# Patient Record
Sex: Male | Born: 1955 | Race: White | Hispanic: No | Marital: Married | Smoking: Former smoker
Health system: Southern US, Community
[De-identification: ages and names within clinical notes are randomized; demographics above are authoritative.]

## PROBLEM LIST (undated history)

## (undated) DIAGNOSIS — C801 Malignant (primary) neoplasm, unspecified: Secondary | ICD-10-CM

## (undated) DIAGNOSIS — F329 Major depressive disorder, single episode, unspecified: Secondary | ICD-10-CM

## (undated) DIAGNOSIS — K449 Diaphragmatic hernia without obstruction or gangrene: Secondary | ICD-10-CM

## (undated) DIAGNOSIS — K649 Unspecified hemorrhoids: Secondary | ICD-10-CM

## (undated) DIAGNOSIS — G4730 Sleep apnea, unspecified: Secondary | ICD-10-CM

## (undated) DIAGNOSIS — F32A Depression, unspecified: Secondary | ICD-10-CM

## (undated) DIAGNOSIS — C439 Malignant melanoma of skin, unspecified: Secondary | ICD-10-CM

## (undated) DIAGNOSIS — E785 Hyperlipidemia, unspecified: Secondary | ICD-10-CM

## (undated) DIAGNOSIS — K611 Rectal abscess: Secondary | ICD-10-CM

## (undated) DIAGNOSIS — B1920 Unspecified viral hepatitis C without hepatic coma: Secondary | ICD-10-CM

## (undated) DIAGNOSIS — E119 Type 2 diabetes mellitus without complications: Secondary | ICD-10-CM

## (undated) DIAGNOSIS — I10 Essential (primary) hypertension: Secondary | ICD-10-CM

## (undated) DIAGNOSIS — K219 Gastro-esophageal reflux disease without esophagitis: Secondary | ICD-10-CM

## (undated) DIAGNOSIS — E669 Obesity, unspecified: Secondary | ICD-10-CM

## (undated) DIAGNOSIS — J45909 Unspecified asthma, uncomplicated: Secondary | ICD-10-CM

## (undated) DIAGNOSIS — M1990 Unspecified osteoarthritis, unspecified site: Secondary | ICD-10-CM

## (undated) HISTORY — DX: Essential (primary) hypertension: I10

## (undated) HISTORY — DX: Major depressive disorder, single episode, unspecified: F32.9

## (undated) HISTORY — DX: Sleep apnea, unspecified: G47.30

## (undated) HISTORY — DX: Gastro-esophageal reflux disease without esophagitis: K21.9

## (undated) HISTORY — DX: Rectal abscess: K61.1

## (undated) HISTORY — DX: Diaphragmatic hernia without obstruction or gangrene: K44.9

## (undated) HISTORY — DX: Malignant (primary) neoplasm, unspecified: C80.1

## (undated) HISTORY — DX: Unspecified hemorrhoids: K64.9

## (undated) HISTORY — PX: ANAL FISSURE REPAIR: SHX2312

## (undated) HISTORY — DX: Malignant melanoma of skin, unspecified: C43.9

## (undated) HISTORY — DX: Unspecified viral hepatitis C without hepatic coma: B19.20

## (undated) HISTORY — DX: Obesity, unspecified: E66.9

## (undated) HISTORY — PX: TONSILLECTOMY: SUR1361

## (undated) HISTORY — DX: Depression, unspecified: F32.A

## (undated) HISTORY — DX: Hyperlipidemia, unspecified: E78.5

## (undated) HISTORY — DX: Type 2 diabetes mellitus without complications: E11.9

## (undated) HISTORY — DX: Unspecified asthma, uncomplicated: J45.909

## (undated) HISTORY — DX: Unspecified osteoarthritis, unspecified site: M19.90

---

## 1987-01-26 HISTORY — PX: ROTATOR CUFF REPAIR: SHX139

## 1996-01-26 HISTORY — PX: HIATAL HERNIA REPAIR: SHX195

## 1997-10-22 ENCOUNTER — Ambulatory Visit (HOSPITAL_COMMUNITY): Admission: RE | Admit: 1997-10-22 | Discharge: 1997-10-22 | Payer: Self-pay | Admitting: Gastroenterology

## 1997-10-22 ENCOUNTER — Encounter: Payer: Self-pay | Admitting: Gastroenterology

## 1998-05-23 ENCOUNTER — Encounter: Payer: Self-pay | Admitting: Gastroenterology

## 1998-05-23 ENCOUNTER — Ambulatory Visit (HOSPITAL_COMMUNITY): Admission: RE | Admit: 1998-05-23 | Discharge: 1998-05-23 | Payer: Self-pay | Admitting: Gastroenterology

## 2000-01-14 ENCOUNTER — Encounter: Admission: RE | Admit: 2000-01-14 | Discharge: 2000-04-13 | Payer: Self-pay | Admitting: Family Medicine

## 2001-09-19 ENCOUNTER — Ambulatory Visit (HOSPITAL_BASED_OUTPATIENT_CLINIC_OR_DEPARTMENT_OTHER): Admission: RE | Admit: 2001-09-19 | Discharge: 2001-09-19 | Payer: Self-pay | Admitting: Orthopedic Surgery

## 2007-03-21 ENCOUNTER — Ambulatory Visit: Payer: Self-pay | Admitting: Gastroenterology

## 2007-03-27 ENCOUNTER — Ambulatory Visit: Payer: Self-pay | Admitting: Gastroenterology

## 2007-04-04 ENCOUNTER — Ambulatory Visit: Payer: Self-pay | Admitting: Internal Medicine

## 2007-08-18 ENCOUNTER — Encounter: Payer: Self-pay | Admitting: Orthopedic Surgery

## 2007-08-18 ENCOUNTER — Inpatient Hospital Stay (HOSPITAL_COMMUNITY): Admission: AD | Admit: 2007-08-18 | Discharge: 2007-08-20 | Payer: Self-pay | Admitting: Orthopedic Surgery

## 2007-12-05 ENCOUNTER — Encounter: Payer: Self-pay | Admitting: Gastroenterology

## 2008-01-01 ENCOUNTER — Ambulatory Visit (HOSPITAL_COMMUNITY): Admission: RE | Admit: 2008-01-01 | Discharge: 2008-01-01 | Payer: Self-pay | Admitting: *Deleted

## 2008-01-11 ENCOUNTER — Ambulatory Visit (HOSPITAL_COMMUNITY): Admission: RE | Admit: 2008-01-11 | Discharge: 2008-01-12 | Payer: Self-pay | Admitting: Surgery

## 2008-01-12 ENCOUNTER — Encounter: Payer: Self-pay | Admitting: Gastroenterology

## 2008-01-26 HISTORY — PX: ROTATOR CUFF REPAIR: SHX139

## 2008-01-26 HISTORY — PX: LAPAROSCOPIC GASTRIC BANDING: SHX1100

## 2008-01-30 ENCOUNTER — Encounter: Admission: RE | Admit: 2008-01-30 | Discharge: 2008-01-30 | Payer: Self-pay | Admitting: *Deleted

## 2008-02-01 ENCOUNTER — Ambulatory Visit (HOSPITAL_COMMUNITY): Admission: RE | Admit: 2008-02-01 | Discharge: 2008-02-01 | Payer: Self-pay | Admitting: *Deleted

## 2008-04-05 ENCOUNTER — Encounter: Admission: RE | Admit: 2008-04-05 | Discharge: 2008-05-06 | Payer: Self-pay | Admitting: *Deleted

## 2008-04-22 ENCOUNTER — Ambulatory Visit (HOSPITAL_COMMUNITY): Admission: RE | Admit: 2008-04-22 | Discharge: 2008-04-23 | Payer: Self-pay | Admitting: *Deleted

## 2009-09-11 ENCOUNTER — Ambulatory Visit: Payer: Self-pay | Admitting: Gastroenterology

## 2009-12-11 ENCOUNTER — Ambulatory Visit: Payer: Self-pay | Admitting: Gastroenterology

## 2010-03-19 ENCOUNTER — Ambulatory Visit: Payer: Self-pay | Admitting: Gastroenterology

## 2010-05-07 LAB — CBC
HCT: 43.9 % (ref 39.0–52.0)
Hemoglobin: 13.3 g/dL (ref 13.0–17.0)
Hemoglobin: 14.7 g/dL (ref 13.0–17.0)
MCHC: 33 g/dL (ref 30.0–36.0)
MCHC: 33.5 g/dL (ref 30.0–36.0)
MCV: 89.3 fL (ref 78.0–100.0)
RBC: 4.45 MIL/uL (ref 4.22–5.81)
RBC: 4.92 MIL/uL (ref 4.22–5.81)
RDW: 12.9 % (ref 11.5–15.5)

## 2010-05-07 LAB — DIFFERENTIAL
Basophils Absolute: 0 10*3/uL (ref 0.0–0.1)
Basophils Absolute: 0 10*3/uL (ref 0.0–0.1)
Basophils Relative: 0 % (ref 0–1)
Lymphocytes Relative: 18 % (ref 12–46)
Lymphocytes Relative: 19 % (ref 12–46)
Lymphs Abs: 1.4 10*3/uL (ref 0.7–4.0)
Monocytes Absolute: 0.7 10*3/uL (ref 0.1–1.0)
Neutro Abs: 5.2 10*3/uL (ref 1.7–7.7)
Neutrophils Relative %: 70 % (ref 43–77)
Neutrophils Relative %: 74 % (ref 43–77)

## 2010-05-07 LAB — GLUCOSE, CAPILLARY
Glucose-Capillary: 113 mg/dL — ABNORMAL HIGH (ref 70–99)
Glucose-Capillary: 119 mg/dL — ABNORMAL HIGH (ref 70–99)
Glucose-Capillary: 130 mg/dL — ABNORMAL HIGH (ref 70–99)
Glucose-Capillary: 131 mg/dL — ABNORMAL HIGH (ref 70–99)
Glucose-Capillary: 237 mg/dL — ABNORMAL HIGH (ref 70–99)
Glucose-Capillary: 37 mg/dL — CL (ref 70–99)
Glucose-Capillary: 96 mg/dL (ref 70–99)

## 2010-05-07 LAB — COMPREHENSIVE METABOLIC PANEL
BUN: 20 mg/dL (ref 6–23)
CO2: 29 mEq/L (ref 19–32)
Calcium: 9.3 mg/dL (ref 8.4–10.5)
Creatinine, Ser: 0.66 mg/dL (ref 0.4–1.5)
GFR calc non Af Amer: >60 mL/min (ref >60)
Glucose, Bld: 126 mg/dL — ABNORMAL HIGH (ref 70–99)
Total Bilirubin: 0.7 mg/dL (ref 0.3–1.2)

## 2010-05-29 IMAGING — RF DG UGI W/ KUB
17 of 18 series · 17 of 18 positions shown · non-contrast
Comparison: none

CLINICAL DATA: Morbid obesity; preop evaluation for bariatric
surgery.

UPPER GI SERIES W/ KUB
TECHNIQUE: After obtaining a scout radiograph a single-column
upper GI series was performed using thin barium.
Fluoroscopy time: 1.9 minutes

[Series 1: run · 1 of 1 slices shown (1 of 15)]
[im 1/1]
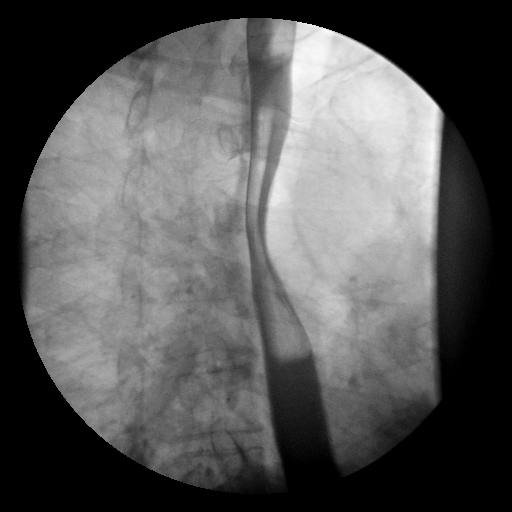

[Series 2: run · 1 of 1 slices shown (2 of 15)]
[im 1/1]
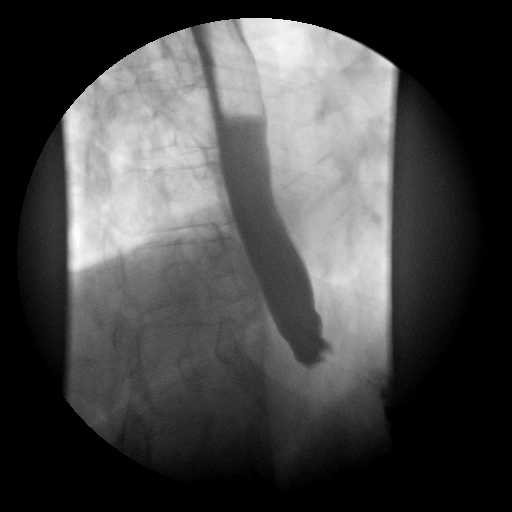

[Series 3: run · 1 of 1 slices shown (3 of 15)]
[im 1/1]
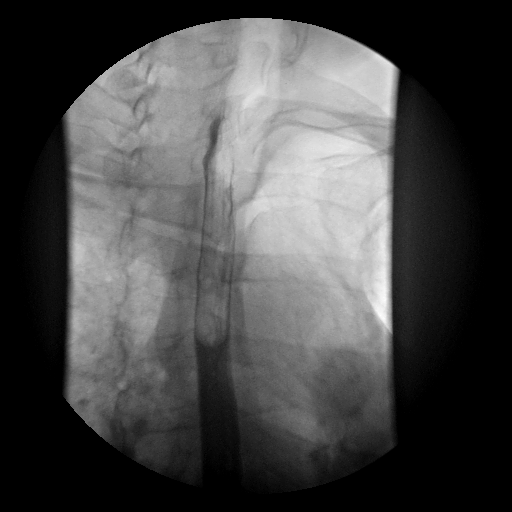

[Series 4: run · 1 of 1 slices shown (4 of 15)]
[im 1/1]
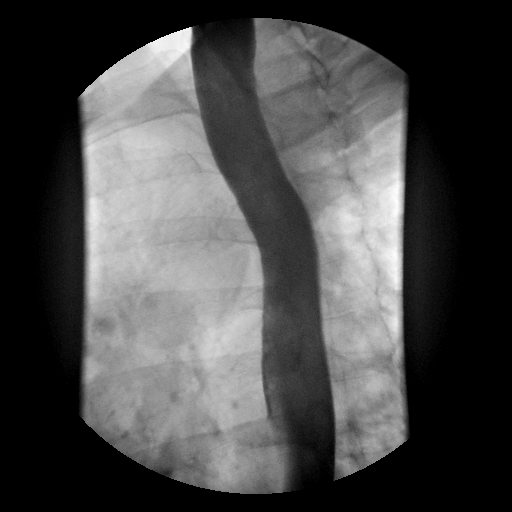

[Series 5: run · 1 of 1 slices shown (5 of 15)]
[im 1/1]
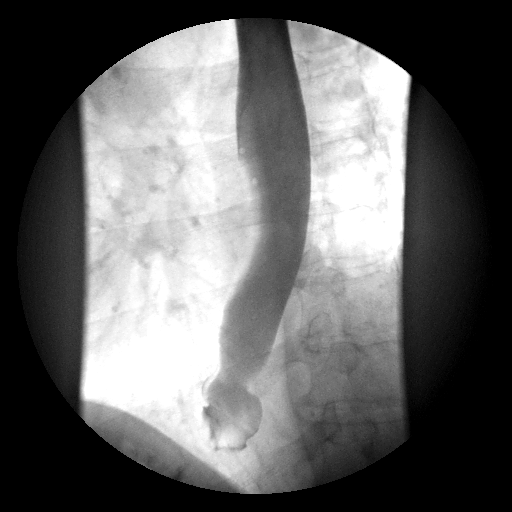

[Series 6: run · 1 of 1 slices shown (6 of 15)]
[im 1/1]
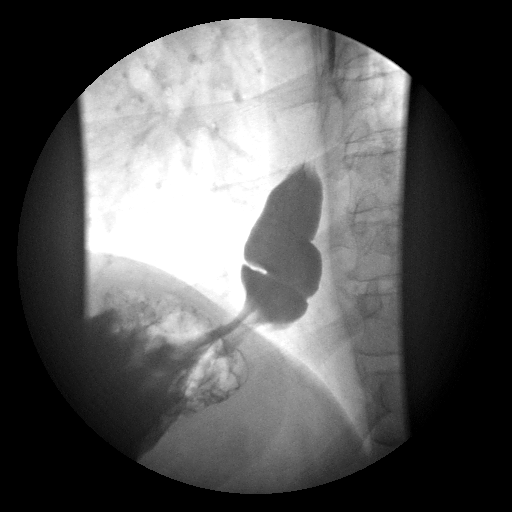

[Series 7: run · 1 of 1 slices shown (7 of 15)]
[im 1/1]
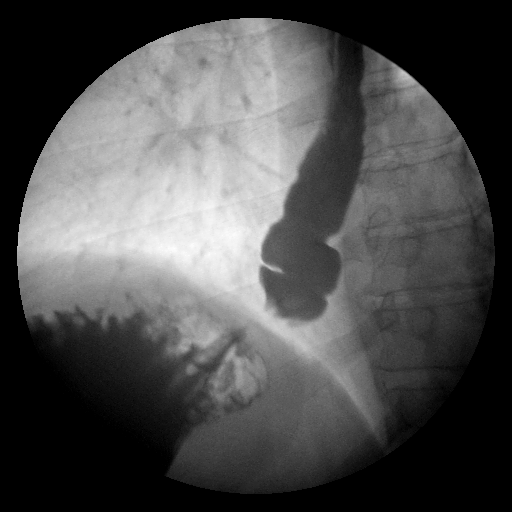

[Series 8: run · 1 of 1 slices shown (8 of 15)]
[im 1/1]
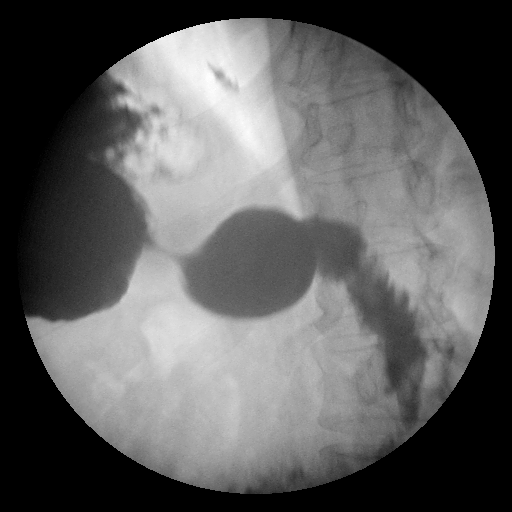

[Series 10: run · 1 of 1 slices shown (9 of 15)]
[im 1/1]
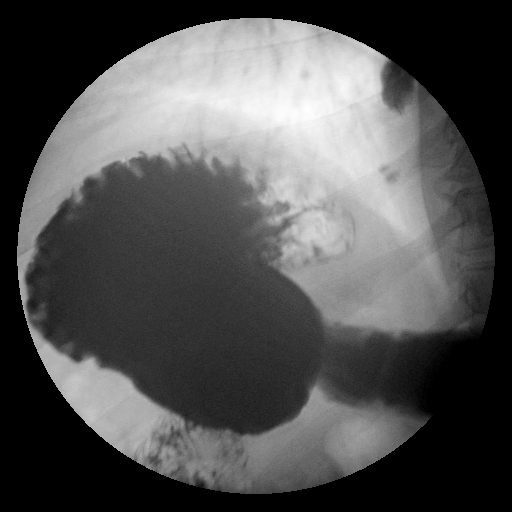

[Series 11: run · 1 of 1 slices shown (10 of 15)]
[im 1/1]
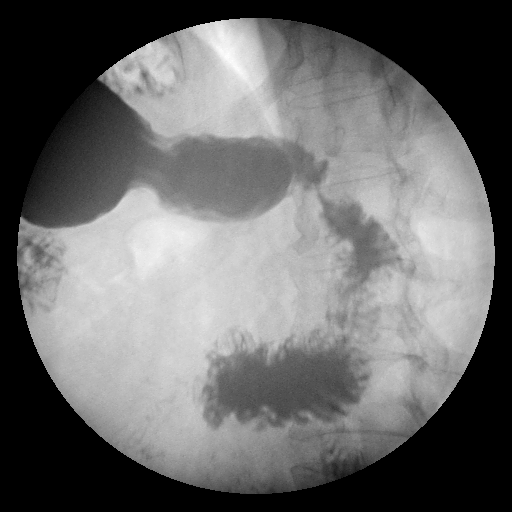

[Series 12: run · 1 of 1 slices shown (11 of 15)]
[im 1/1]
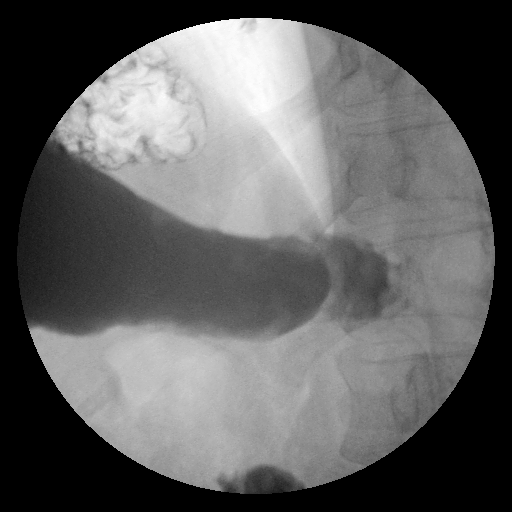

[Series 13: run · 1 of 1 slices shown (12 of 15)]
[im 1/1]
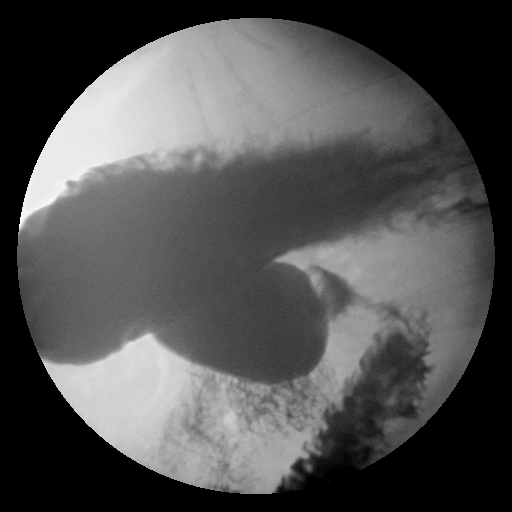

[Series 14: run · 1 of 1 slices shown (13 of 15)]
[im 1/1]
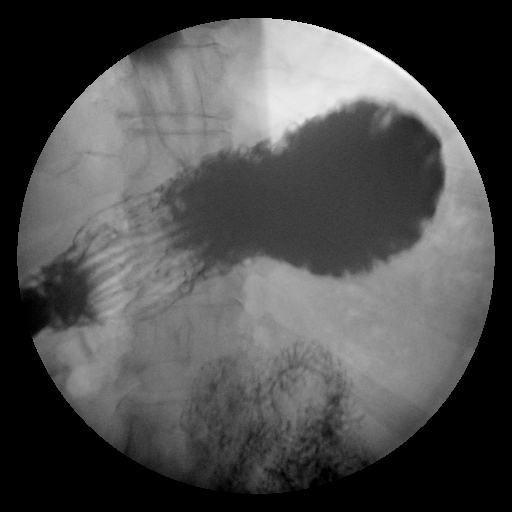

[Series 15: run · 1 of 1 slices shown (14 of 15)]
[im 1/1]
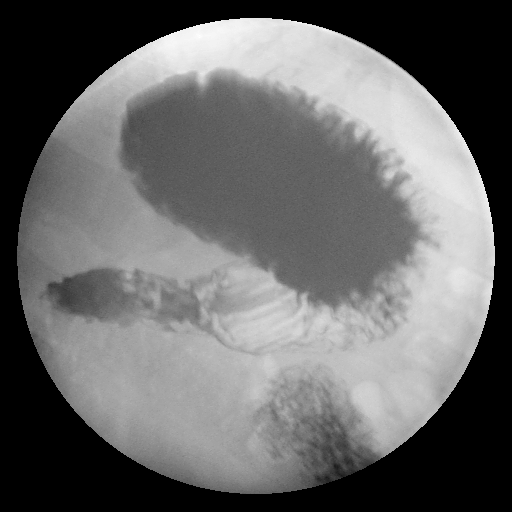

[Series 16: run · 1 of 1 slices shown (15 of 15)]
[im 1/1]
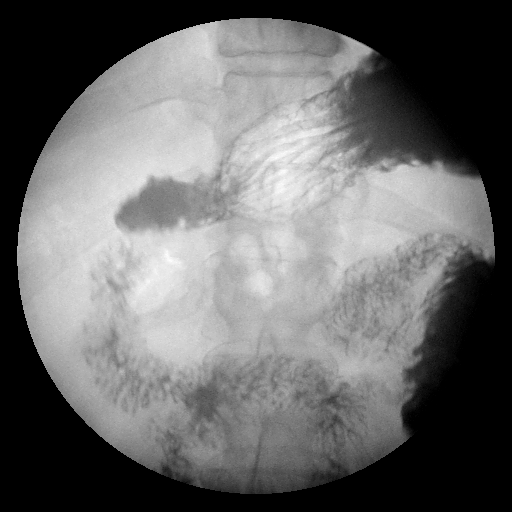

[Series 1001: view not recorded · 0.20mm/px · 1 of 1 slices shown (1 of 2)]
[im 1/1]
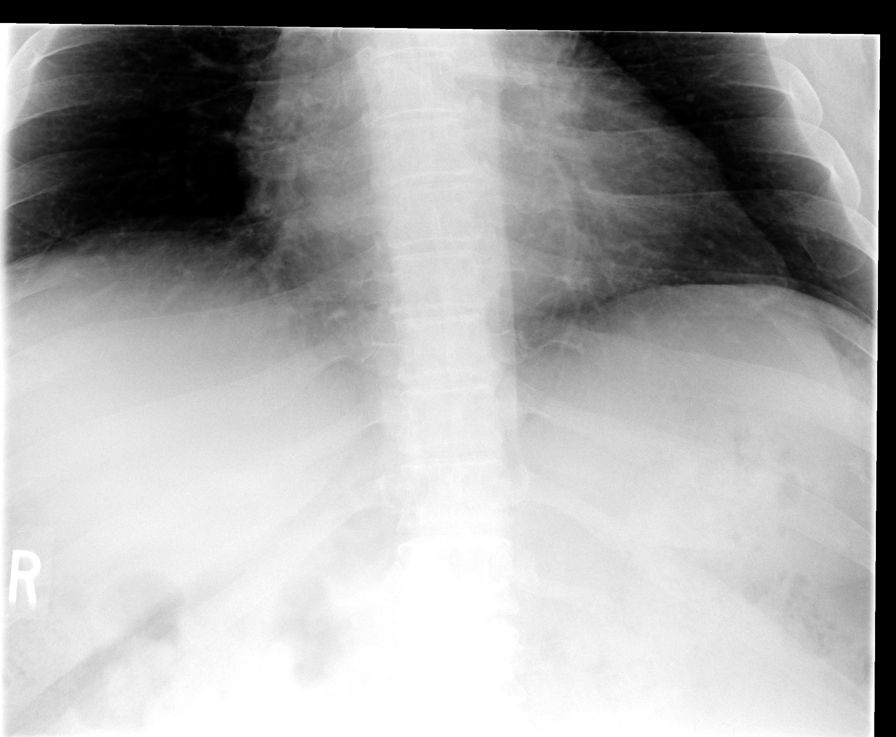

[Series 1002: view not recorded · 0.20mm/px · 1 of 1 slices shown (2 of 2)]
[im 1/1]
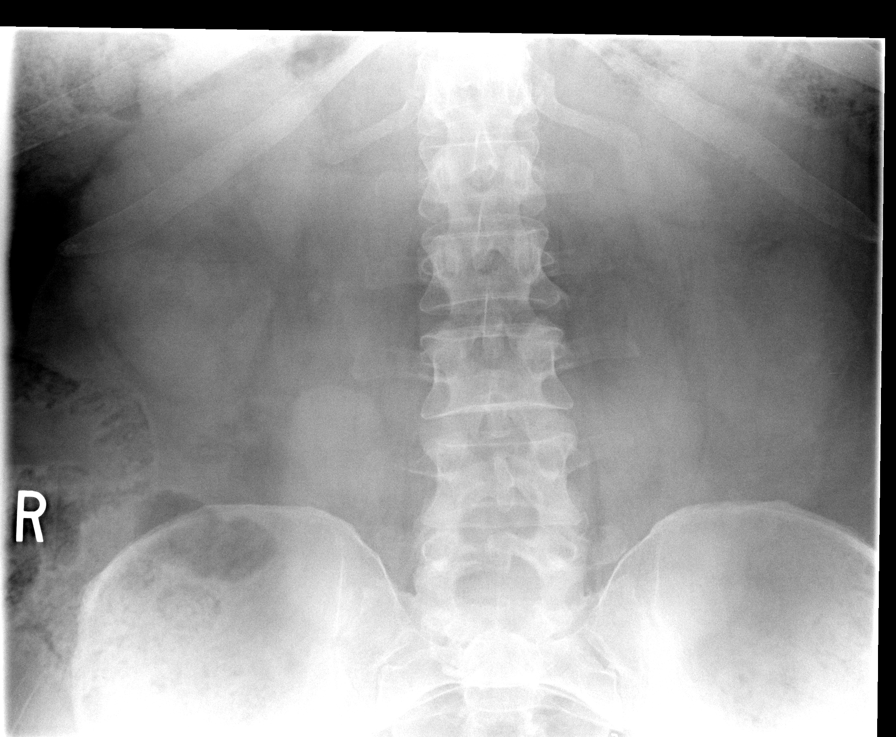

[17 of 18 positions shown; findings below may reference images not displayed]

FINDINGS: The scout radiograph shows a normal bowel gas pattern.

A tiny sliding hiatal hernia is seen.  There is no evidence of
esophageal mass or stricture.  No gastroesophageal reflux was seen
during the exam.  Esophageal motility is within normal limits.

The stomach is otherwise normal in appearance.  There is no
evidence of gastric masses or ulcers.  Duodenal bulb and sweep are
normal in appearance.
IMPRESSION: Tiny sliding hiatal hernia.  No evidence of esophageal stricture or
other significant abnormality.

## 2010-06-09 NOTE — Op Note (Signed)
NAME:  [name], [name]                  ACCOUNT NO.:  [id number]   MEDICAL RECORD NO.:  [id number]          PATIENT TYPE:  OIB   LOCATION:  1532                         FACILITY:  [facility]   PHYSICIAN:  Alfonse Ras, MD   DATE OF BIRTH:  1955/07/11   DATE OF PROCEDURE:  DATE OF DISCHARGE:                               OPERATIVE REPORT   PREOPERATIVE DIAGNOSIS:  Medically refractory morbid obesity.   POSTOPERATIVE DIAGNOSIS:  Medically refractory morbid obesity, no  evidence of hiatal hernia.   PROCEDURE:  Laparoscopic adjustable gastric banding with the APL system  and placement of subcutaneous port with atrium mesh.   ANESTHESIA:  General.   SURGEON:  Alfonse Ras, MD   ASSISTANT:  Thornton Park. Daphine Deutscher, MD.   DESCRIPTION:  After extensive informed consent was granted from the  patient both in the office and the preoperative holding, he was taken to  the operating room.  The abdomen was prepped and draped in normal  sterile fashion and a time-out was performed.  Using an 11-mm trocar in  the left upper quadrant, peritoneal access was obtained under direct  vision.  Additional 15-mm and 11-mm trocars were placed in the right  abdomen, and 11-mm trocar was placed in the left paramedian position.  A  5-mm trocar was placed in subxiphoid region.  The patient was placed in  steep head-up position.  Nathanson liver retractor was placed and the  left lateral segment was retracted anteriorly.  The sizing tube was  placed down into the stomach and 15 mL of air blown up in the balloon  was pulled back.  No dimpling was seen, and no evidence of hiatal  hernia.  This was let down and the tube was brought back into the  esophagus.  I then performed a dissection at the angle of Hiss, both  sharply and bluntly, and then using a pars flaccida technique, I placed  the band passer in a retrogastric position and brought it out at the  angle of His.  An APL band was then placed into the abdomen and  placed  with a lap band passer.  This was pulled tight around the sizing tube  which had been replaced.  It moved easily.  Anterior fundoplication was  performed with interrupted 2-0 Ethilon sutures.  These were secured with  tie knots.  Adequate hemostasis was ensured.  The tubing was brought out  through the lower of the right upper quadrant incisions and attached to  the port.  The port had atrium mesh placed in the posterior wall.  A  subcutaneous pocket was created and it was placed in this pocket.  A 2-0  Vicryl closure was performed in subcutaneous fashion overlying the port.  All other incisions were closed with subcuticular 4-0 Monocryl.  The  port incision was then closed with a subcuticular 4-0 Monocryl.  Steri-  Strips and sterile dressings were applied.  The patient tolerated the  procedure well and went to PACU in good condition.      Alfonse Ras, MD  Electronically Signed  KRE/MEDQ  D:  04/22/2008  T:  04/22/2008  Job:  [id number]

## 2010-06-09 NOTE — Op Note (Signed)
NAMEJEANETTE, [name]                  ACCOUNT NO.:  [id number]   MEDICAL RECORD NO.:  [id number]          PATIENT TYPE:  AMB   LOCATION:  DSC                          FACILITY:  [facility]   PHYSICIAN:  Katy Fitch. Sypher, M.D. DATE OF BIRTH:  [birth year]   DATE OF PROCEDURE:  08/17/2007  DATE OF DISCHARGE:                               OPERATIVE REPORT   PREOPERATIVE DIAGNOSES:  Severe pain right shoulder with plain x-ray  evidence of acromioclavicular arthropathy and evidence of adhesive  capsulitis and chronic stage II or III impingement.  Unfortunately, Mr.  Baker was unable to tolerate an MRI, therefore we did not have an  accurate preoperative diagnosis as to the health of his rotator cuff and  periarticular structures, rule out rotator cuff tear, and rule out  arthritis of glenohumeral joint.   POSTOPERATIVE DIAGNOSES:  1. Severe adhesive capsulitis.  2. Acromioclavicular degenerative arthritis.  3. Chronic stage II impingement.  4. Bursal adhesions due to chronic impingement.   OPERATION:  1. Examination of right shoulder under anesthesia, identifying marked      adhesive capsulitis.  2. Manipulation of right shoulder, lysing adhesions, and recovery of      motion from 120 degrees of combined elevation to 170 degrees of      combined elevation.  External rotation at 90 degrees and abduction      from 40 degrees to 80 degrees and internal rotation at 90 degrees      and abduction from 10 degrees to 70 degrees.  3. Diagnostic arthroscopy, right glenohumeral joint documenting      minimal glenohumeral degenerative arthritis, but 4+ adhesive      capsulitis with obliteration of subscapularis recess and extensive      scarring of rotator interval and anterior glenohumeral ligaments.  4. Arthroscopic bursectomy, subacromial decompression with a partial      relaxation of coracoacromial ligament and tenolysis of rotator      cuff.  5. Arthroscopic distal clavicle resection.   OPERATING  SURGEON:  Katy Fitch. Sypher, MD   ASSISTANT:  Annye Rusk PA-C   ANESTHESIA:  General by endotracheal technique supplemented by a right  interscalene block.   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 of [facility]  for evaluation of a severely painful right shoulder.   He had a history of remote shoulder surgery on the left by Dr. Meade Maw  and Dr. Fredric Mare with residual left-sided symptoms.   [name] is a very large man.  He is 6 feet 3 inches tall and weighs 323  pounds with a body mass index of 40.5.  He has insulin-dependent  diabetes that has been managed by Dr. Arlyce Dice with Lantus insulin,  Glucophage, glipizide, and Byetta 10 mg b.i.d.   [name] has only moderate control of his diabetes with fasting glucoses  in the 200 range.   He was referred by Dr. Arlyce Dice in December 2008 for evaluation of his  shoulder pain.  Clinical  examination suggested adhesive capsulitis and  possible rotator cuff pathology.  He had reported 2 prior surgeries on  the left side.  We advised him to obtain an MRI of his shoulder.  Despite sedation and 3 attempts with an open machine, he was unable to  tolerate MRI imaging due to severe claustrophobia.  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   He had a number of complicating family circumstances including the  illness and death of his mother, therefore intervention with the  shoulder predicament was delayed until July 2009.   After lengthy informed consent x2, he is now brought to the operating  room anticipating arthroscopic evaluation of the shoulder on a best  efforts basis followed by appropriate intervention.   Preoperatively, we prepared him for possible rotator cuff repair,  subacromial  decompression, distal clavicle resection, and debridement of  the joint as necessary.  Preoperatively, questions were invited and  answered in detail.   PROCEDURE:  [name] was brought to the operating room and placed  in supine position upon the operating table.   Preoperatively, he was interviewed by Dr. Gypsy Balsam, and after informed  consent had a right interscalene block placed with an intermediate  acting local anesthetic agent.  [name] due to his large size did have  some respiratory distress following the block due to probable block in  the phrenic nerve.  His O2 sats on room air range between 88% and 94%.   On oxygen, he was stable.   He was transferred to room 1, placed in supine position on the table,  and under Dr. Burnett Corrente direct supervision, general anesthesia induced by  endotracheal technique utilizing the glide scope.  He was carefully  positioned in the beach-chair position with aid of a torso and head  holder designed for shoulder arthroscopy.  The right arm was prepped  with DuraPrep and draped with impervious arthroscopy drapes.  Ancef 1 g  was administered as an IV prophylactic antibiotic.   The procedure commenced with instrumenting the shoulder with a 20-gauge  spinal needle followed by distention of the shoulder with sterile  saline.  Due to his large size, the arthroscope was introduced with a  switching stick brought in anteriorly followed by placement of the scope  through a standard posterior viewing portal.  Diagnostic arthroscopy  revealed profound adhesive capsulitis.   Examination of the shoulder under anesthesia had revealed significant  impairment of capsular motion with combined elevation 120, external  rotation 40, internal rotation 10, and lack of extension 30.  With  gentle manipulation, the range of motion was increased to combined  elevation to 170, external rotation to 80, internal rotation to 70, and  extension to neutral.   An  anterior portal was created under direct vision with a switching  stick followed by use of a suction shaver to debride the adhesions  essentially obliterating the anatomy of the anterior glenohumeral  ligaments.  The subscapularis was tenolysed and the rotator interval  reestablished.  The long head of the biceps had a stable origin at the  superior labrum.  The biceps was normal through the rotator interval and  did not appear to have any signs of significant fraying in the  intertubercular groove.  The deep surface of the rotator cuff was  inspected.  There was some cavitary tendinopathy of the  supraspinatus,  anterior, and middle portions without evidence of a significant  retracted tear.  The infraspinatus was in normal appearance.   After thorough debridement of all the granulation tissue in the shoulder  and use of the cautery to perform an anterior and partial inferior  capsulectomy, the cautery was used to obtain hemostasis.   The scope was removed after photographic documentation of the  glenohumeral joint condition and the scope was subsequently placed in  the subacromial space.  A lateral portal was created with switching  stick followed by tenolysis of the rotator cuff, extensive bursal  debridement, identification of coracoacromial ligament, and leveling of  the acromion to a type 1 morphology with preservation of at least two-  thirds of the coracoacromial ligament.   An anterior portal was created adjacent to the [facility] capsule followed by  use of the suction shaver and cautery to take down the AC capsule.  The  large inferior projecting osteophyte of the distal clavicle was  documented with digital camera followed by use of a suction shaver to  perform an arthroscopic resection of the distal clavicle at least 15 mm.   Photographic documentation of the release of the [facility] spur and proper  resection was accomplished with a digital camera.   After complete tenolysis of the  rotator cuff, hemostasis was achieved  with the bipolar cautery.  The scope was removed followed by repair of  the portals.   There were no apparent complications.  [name] tolerated the surgery  and the anesthesia well.  He was awakened from general anesthesia and  transferred to recovery room in stable signs.   Due to his body mass, diabetes, and respiratory distress preoperatively,  he will be observed in the recovery care center overnight.  We  anticipate our block will wear off within 4 hours and we should be able  to obtain good pulmonary toilet with an incentive spirometer and  breathing exercises.   He will be encouraged to walk immediately.  Passive calf compression  devices were used during anesthesia for deep vein thrombosis  prophylaxis.   We will continue to monitor his blood glucose.  We anticipate discharge  in 24 hours.       Katy Fitch Sypher, M.D.  Electronically Signed     RVS/MEDQ  D:  08/17/2007  T:  08/17/2007  Job:  [id number]   cc:   Teena Irani. Arlyce Dice, M.D.

## 2010-06-09 NOTE — Assessment & Plan Note (Signed)
[facility]                         GASTROENTEROLOGY OFFICE NOTE   NAME:[name], [name]                         MRN:          [id number]  DATE:04/04/2007                            DOB:          Oct 30, 1955    CHIEF COMPLAINT:  Rectal bleeding and pus drainage from the rectum.   HISTORY:  This is a 56 year old white man that has had persistent rectal  bleeding since he underwent colonoscopy and hemorrhoidal sclerosis,  March 27, 2007.  Over time he felt like something was growing down  there, and when he pressed on it and pushed, he saw purulent drainage  mixed with fecal material and blood.  He has not had fever.  It is not  very painful.  He is defecating in a relatively normal fashion, he says.  There are no new problems reported.  His medications are listed and  reviewed in the chart.  There are no known drug allergies.  I have  reviewed his past medical history, it is reviewed and unchanged other  than that mentioned above (the colonoscopy was otherwise unremarkable).  The past medical history is as listed in Dr. Ardell Isaacs note of March 21, 2007.   PHYSICAL EXAMINATION:  Physical examination reveals a morbidly obese  white man.  Weight 338 pounds, pulse 78, blood pressure 140/64.  He is alert and oriented x3.  Appropriate mood and affect.  Inspection of the anal area demonstrates on the left posterior aspect of  the perianal area, sort of somewhat lateral and inferior to, in the 3  o'clock to 6 o'clock area, extending for several centimeters is a dark  discoloration of the skin, more violaceous in color.  When I palpate  that and press on the area, there is no fluctuance or tenderness, but  from the 3 o'clock area of the anus, mucopurulent debris is expressed.  He has a tag in the anal canal from 9 o'clock to 3 o'clock position  which does not look inflamed.  There is no other fluctuance or area of  suspected abscess or other abnormality.  A  complete digital exam is not  performed.   ASSESSMENT:  Perianal abscess after sclerosing of hemorrhoids on March 27, 2007.   PLAN:  He will see Dr. Karie Soda today at 4 p.m.  I have withheld  antibiotics at this time.  This may heal with antibiotics since he is  draining, but the patient is planning to travel to [place] in the  next week or so to visit family, and I have asked him to see Dr. Michaell Cowing  for further evaluation and treatment to try to expedite the healing  process if at all possible.  Further plans pending clinical course.     Iva Boop, MD,FACG  Electronically Signed    CEG/MedQ  DD: 04/04/2007  DT: 04/04/2007  Job #: [id number]   cc:   Venita Lick. Russella Dar, MD, Clementeen Graham

## 2010-06-09 NOTE — Consult Note (Signed)
NAMECYPRESS, FANFAN                  ACCOUNT NO.:  [id number]   MEDICAL RECORD NO.:  [id number]          PATIENT TYPE:  INP   LOCATION:  5039                         FACILITY:  [place]   PHYSICIAN:  Youlanda Mighty. Sypher, M.D. DATE OF BIRTH:  [birth year]   DATE OF CONSULTATION:  08/18/2007  DATE OF DISCHARGE:                                 CONSULTATION   REQUESTING PHYSICIAN:  Youlanda Mighty. Sypher, MD   REASON FOR CONSULTATION:  Hypoxia.   IMPRESSION AND RECOMMENDATIONS:  1. Transient hypoxemia secondary to anesthesia/atelectasis/pain      medications.  The patient's saturations are currently in the 90s on      room air.  He is comfortable and has clear lung sounds.  I agree      with monitoring overnight.  I suspect he may have obesity      hypoventilation syndrome/obstructive sleep apnea based on his body      habitus, and I have recommended outpatient sleep study, which can      be arranged by his primary care physician.  2. Diabetes.  3. Obesity.  4. Hyperlipidemia.  5. Hypertension.  6. Gastroesophageal reflux disease.  7. Chronic cough, possibly related to ACE inhibitor versus reflux.  8. Asthma.  I recommend resuming Advair.  9. Status post surgery for right shoulder adhesive capsulitis.   HISTORY OF PRESENT ILLNESS:  Mr. Isidore is a pleasant 55 year old white  male, who goes to [employer], who had surgery on his  right shoulder.  Postoperatively, he has had periods of hypoxia, which  corrects with supplemental nasal cannula oxygen.  He reports no  shortness of breath currently.  He denies any chest pain.  He has a  history of asthma, but denies any wheezing today.   PAST MEDICAL HISTORY:  As above.   MEDICATIONS:  At home he takes:  1. Prilosec 20 mg a day.  2. Aspirin 81 mg a day.  3. Lantus 100 units subcutaneously nightly.  4. Glucophage 1000 mg p.o. b.i.d.  5. Glipizide 10 mg a day.  6. Wellbutrin SR 150 mg a day.  7. Vicodin as needed.  8. Altace 10 mg  a day.  9. Advair occasionally.  10.Byetta 10 mcg subcutaneously daily.  11.Vytorin 10/40 mg a day.   SOCIAL HISTORY:  The patient smokes cigar occasionally.  He used to  smoke a few cigarettes a day, but quit in the 80s.  He does not drink or  use drugs.  He is married and repairs motorcycles.   FAMILY HISTORY:  His mother recently died of glioblastoma multiforme.   REVIEW OF SYSTEMS:  As above, otherwise negative.   PHYSICAL EXAMINATION:  VITAL SIGNS:  His temperature is 97.2, pulse 97,  respiratory rate 20, blood pressure 132/59, and oxygen saturations were  down into the 80s earlier today, currently in the mid 90s off oxygen.  GENERAL:  The patient is an obese white male in no acute distress.  HEENT:  Normocephalic and atraumatic.  Pupils are equal, round, and  reactive to light.  Sclerae nonicteric.  Moist mucous  membranes.  NECK:  He has a very thick neck, which is supple.  LUNGS:  Clear to auscultation bilaterally without wheezes, rhonchi, or  rales.  CARDIOVASCULAR:  Regular rate and rhythm without murmurs, gallops, or  rubs.  ABDOMEN:  Obese, soft, nontender, and nondistended.  GU AND RECTAL:  Deferred.  EXTREMITIES:  He has trace bilateral pitting edema.  Pulses are intact.  No cyanosis.  NEUROLOGIC:  The patient is alert and oriented.  Cranial nerves and  sensorimotor exam are intact.  PSYCHIATRIC:  Normal affect.  SKIN:  No rash.   LABORATORY DATA:  Hemoglobin from yesterday was 14.9.  Basic metabolic  panel from August 15, 2007, was significant for a glucose of 276.  His  blood glucose currently is 270.  Chest x-ray, 1 view shows right basilar  linear atelectasis or infiltrate.  No pleural effusion or edema.   Thank you Dr. Daylene Katayama for this consultation.  We will follow along.      Corinna L. Conley Canal, MD  Electronically Signed      Youlanda Mighty. Sypher, M.D.  Electronically Signed    CLS/MEDQ  D:  08/18/2007  T:  08/19/2007  Job:  [id number]   cc:   Youlanda Mighty.  Sypher, M.D.  Nehemiah Settle, M.D.

## 2010-06-09 NOTE — Op Note (Signed)
NAME:  [name], [name]                  ACCOUNT NO.:  [id number]   MEDICAL RECORD NO.:  [id number]          PATIENT TYPE:  OIB   LOCATION:  1321                         FACILITY:  [facility]   PHYSICIAN:  Ardeth Sportsman, MD     DATE OF BIRTH:  03-Mar-1955   DATE OF PROCEDURE:  DATE OF DISCHARGE:  01/12/2008                               OPERATIVE REPORT   PRIMARY CARE PHYSICIAN:  Teena Irani. Arlyce Dice, M.D. of [facility].   GASTROENTEROLOGIST:  Venita Lick. Russella Dar, MD, Clementeen Graham.   SURGEON:  Ardeth Sportsman, MD.   ASSISTANT:  None.   PREOPERATIVE DIAGNOSES:  1. Persistent anterior perianal fistula.  2. Internal and external hemorrhoids.   POSTOPERATIVE DIAGNOSES:  1. Persistent anterior perianal fistula.  2. Internal and external hemorrhoids.   PROCEDURE PERFORMED:  1. Examination under anesthesia.  2. Anterior fistulotomy with resection of fistulous tract and      debridement.  3. Ligation of right posterior bleeding internal hemorrhoid.   ANESTHESIA:  1. General anesthesia.  2. Bilateral anorectal block using bupivacaine with Wydase.   ESTIMATED BLOOD LOSS:  20 mL.   SPECIMENS:  None.   DRAINS:  None.   INDICATIONS:  [name] is a morbidly obese 55 year old gentleman with  diabetes, sleep apnea and hypertension who had a perirectal abscess that  required urgent drainage.  Unfortunately, he has had persistent drainage  throughout the year and came to me last month with  persistent drainage.   On examination, I was concerned of a fistulas tract.  Differential  diagnosis was discussed.  Anatomy and physiology of the anorectal canal  was discussed.  The technique, examination under anesthesia with  possible left fistulotomy versus seton placement versus flap closure  were discussed.  The risks, benefits, and alternatives were discussed  and questions answered.  He agreed to proceed.   OPERATIVE FINDINGS:  He had some moderately enlarged external  hemorrhoids, but no  active bleeding.  He did have some internal  moderately inflamed internal hemorrhoids at the right posterior that had  some bleeding associated with it.   He had a fistulous tract going from at the dentate line in the anterior  midline to one of the crypts that was continuing anteriorly and going  into the left intergluteal space.  It was inferior and superficial to  the sphincters.  It did not involve the sphincter mechanism.   DESCRIPTION OF PROCEDURE:  Informed consent was confirmed.  The patient  received IV cefoxitin just prior to surgery.  He underwent general  anesthesia without any difficulty.  He was positioned prone given the  anterior location.  His blood pressure and saturations were fine  throughout the entire case.  His perianal and perineal regions were  prepped and draped in sterile fashion.   Digital rectal examination palpated and anoscopy showed findings noted  above.   I went ahead and used a thin probe and could easily connect a more  proximal tract about 2 cm distal from the anal crypt.  It was obviously  distal to the sphincter  mechanism and did not involve the muscles so I  went ahead and opened up the tract using cut cautery.  In freeing this  up there was any more distal tract going up into the left gluteal cavity  going to gluteal space as well.  This connected with another opening up  on the left inner gluteus about 3 cm distally.  Careful probing was done  again.  This was in the soft tissues.  It was off the midline and did  not seem to be involving his urethra at all.  His prostate was very far  away.  Therefore, I opened that up with cautery as well.   In opening up these tracts there was obvious chronic granulation tissue  and fistulous tract.  This was debrided off completely.  I removed  excess folds of skin and tissue to have more of a cratering wide-open  fistulotomy since he is morbidly obese and had a lot of redundant  tissue.  I was trying to  keep it open and flat so that it would help  granulate in.  Hemostasis was assured.   He did have some bleeding in his rectum and his right posterior  hemorrhoid did have some bleeding associated with it and controlled with  cautery.  Therefore, I ligated using a figure-of-eight 2-0 Vicryl stitch  to good result.  He had he did have a posterior midline tear, but not a  true fissure just distal to the anoderm.  His sphincter mechanism was  completely intact and normal.  He that after we had moved him over  prone, so I guess he was having some issues of tearing there.  I just  did some light touch cautery to help for hemostasis, but did not get  more aggressive then that.   The patient was the rolled over and sent to the recovery room in stable  condition.  He did have problems with respiratory difficulty on his last  surgery so we will at least monitor him in recovery for several hours.  If he is alert and does well he can leave a few hours later versus  continuing to watch him overnight.      Ardeth Sportsman, MD  Electronically Signed     SCG/MEDQ  D:  01/11/2008  T:  01/12/2008  Job:  [id number]   cc:   Teena Irani. Arlyce Dice, M.D.  Fax: [phone]   Venita Lick. Russella Dar, MD, FACG  [address]  [place]  [place] [address]

## 2010-06-09 NOTE — Assessment & Plan Note (Signed)
[facility]                         GASTROENTEROLOGY OFFICE NOTE   NAME:[name]                         MRN:          [id number]  DATE:03/21/2007                            DOB:          [birth year]    REFERRING PHYSICIAN:  Ernestina Penna, M.D.   OFFICE CONSULTATION   REASON FOR CONSULT:  Hematochezia.   HISTORY OF PRESENT ILLNESS:  [name] is a 55 year old white male who  relates about a 1 year history of almost daily bright red blood per  rectum.  He notes rectal bleeding with bowel movements and he also notes  blood in his underwear most mornings.  He relates no rectal pain,  itching, change in bowel habits, change in stool caliber or melena.  He  has a prior history of GERD and noted occasional problems swallowing  solids and liquids but these symptoms have all abated while on Prilosec  OTC.  Patient states he had approximately 3 upper endoscopies performed  over the course of the 1980s and 1990s.  I do not have any records  available, but the patient states he was diagnosed with GERD and a  hiatal hernia.  The patient's father developed colon cancer in his 37s,  no other family members with colon cancer, colon polyps or inflammatory  bowel disease. He is currently awaiting rotator cuff sugery by [name].   PAST MEDICAL HISTORY:  1. Hypertension.  2. Diabetes mellitus.  3. Hyperlipidemia.  4. Obesity.  5. GERD.  6. Hiatal hernia.  7. Depression.  8. History of melanoma.   PAST SURGICAL HISTORY:  1. Status post hernia repair x3.  2. Status post rotator cuff surgeries x2.  3. Status post tonsillectomy.   CURRENT MEDICATIONS:  Listed on the chart, updated and reviewed.   MEDICATION ALLERGIES:  None known.   SOCIAL HISTORY AND REVIEW OF SYSTEMS:  Per the handwritten form.   PHYSICAL EXAM:  A morbidly obese white male in no acute distress.  Height 63 inches, weight 337 pounds, blood pressure is 128/60, pulse 72  and  regular.  HEENT EXAM:  Anicteric sclera.  Oropharynx clear.  CHEST:  Clear to auscultation bilaterally.  CARDIAC:  Regular rate and rhythm without murmurs appreciated.  ABDOMEN:  Large, soft, nontender, nondistended, normoactive bowel  sounds.  No palpable organomegaly, masses or hernias.  RECTAL EXAMINATION:  Reveals an external hemorrhoid, no internal  lesions.  There is traces of bright red blood and mucus on the exam  glove that is strongly Hemoccult positive.  EXTREMITIES:  Without clubbing, cyanosis or edema.  NEUROLOGIC:  Alert and oriented x3.  Grossly nonfocal.   ASSESSMENT AND PLAN:  1. Ongoing rectal bleeding. Hemoccult positive stool. An external      hemorrhoid. Father with colon cancer. Begin Analpram 2.5% cream      twice a day for the external hemorrhoid along with standard      hemorrhoidal and rectal care instructions.  Rule out internal      hemorrhoids, colorectal neoplasms, proctitis and other disorders.      Risks, benefits and alternatives to  colonoscopy with possible      biopsy, possible polypectomy and possible destruction of internal      hemorrhoids discussed with the patient and he consents to proceed.      This will be scheduled electively.  2. Gastroesophageal reflux disease and hiatal hernia.  Will attempt to      obtain his prior endoscopy records.  Begin standard antireflux      measures and continue Prilosec 20 mg by mouth every morning, taken      30 minutes before breakfast.     Judie Petit T. Russella Dar, MD, [facility]  Electronically Signed    MTS/MedQ  DD: 03/21/2007  DT: 03/21/2007  Job #: [id number]   cc:   Rudi Heap, MD

## 2010-06-09 NOTE — Discharge Summary (Signed)
[name], [name]                  ACCOUNT NO.:  [id number]   MEDICAL RECORD NO.:  [id number]          PATIENT TYPE:  INP   LOCATION:  5039                         FACILITY:  [facility]   PHYSICIAN:  Katy Fitch. Sypher, M.D. DATE Baker BIRTH:  28-May-1955   DATE Baker ADMISSION:  08/18/2007  DATE Baker DISCHARGE:  08/20/2007                               DISCHARGE SUMMARY   ADMITTING DIAGNOSIS:  Chronic respiratory insufficiency with  exacerbation status post outpatient diagnostic arthroscopy, right  shoulder, followed by arthroscopic capsulectomy, subacromial  decompression, and distal clavicle resection.   Postop diagnosis and discharge diagnosis include probable chronic  respiratory insufficiency, rule out sleep apnea.  Also, morbid obesity  with rapid recent weight gain.   ADDITIONAL DIAGNOSES:  Type 2 diabetes, hypertension, degenerative  arthritis, depression, recent rectal bleeding, borderline asthma, and a  remote history Baker methicillin-resistant Staphylococcus aureus infection.   DISCHARGE MEDICATIONS:  Dilaudid 2 mg 1 or 2 tablets p.o. q.4-6 h.  p.r.n. pain.   DISCHARGE INSTRUCTIONS:  [name] was advised to return to see [name] in the office on the morning Baker August 21, 2007, anticipating a  wound examination.  A physical therapy referral and arrangements to be  made for a referral for sleep apnea evaluation and subsequent medical  reassessment by his primary care physician, [name], [facility].   Surgical procedure during hospitalization was diagnostic arthroscopy,  right shoulder, with identification Baker severe adhesive capsulitis as  well as chronic subacromial impingement and bursitis with operative  arthroscopy including arthroscopic debridement Baker granulation tissue and  capsulectomy, manipulation Baker right shoulder under anesthesia,  arthroscopic bursectomy, subacromial decompression, and partial  relaxation coracoacromial ligament, and  arthroscopic distal clavicle  resection, all completed at the [facility] on August 17, 2007.   DISPOSITION:  Diet was an 80-g carbohydrate restricted diabetic diet.  [name] was advised to continue with his routine medications including  NovoLog insulin and multiple medications for his hypertension, asthma,  and other medical predicaments that are under Dr. Marzetta Board chronic  management.   LABORATORY STUDIES:  Preoperative lab, BMET, sodium 136, potassium 4.9,  chloride 97, random glucose 276 at 1500 hours on August 15, 2007, BUN 12,  and creatinine 6.5.  Calcium is noted to be 9.3.  Blood gas prior to  admission on room air, pH 7.419, PCO2 52, PO2 67.3, and base excess 8.4.   Preoperative EKG, normal sinus rhythm, no evidence Baker acute abnormality.  Postoperative EKG obtained on the morning Baker August 19, 2007, normal sinus  rhythm, normal EKG, no evidence Baker pulmonary embolus, strain pattern, or  ischemia.  V/Q scan obtained during hospitalization was reported to be  no evidence Baker perfusion defects, normal perfusion Baker both lungs noted.  A portable chest x-ray obtained on August 17, 2007, revealed a small  amount Baker atelectasis, right lung field.  Otherwise, no evidence Baker an  elevated hemidiaphragm or infiltrate.   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 Onalee Hua  Arlyce Baker for  evaluation and management Baker a chronically painful right shoulder.  Mr.  Baker has a number Baker medical challenges including chronic type 2  diabetes treated with insulin and oral hypoglycemic agents, chronic  degenerative arthritis, history Baker asthma, hypertension, prior MRSA  infection, depression, and a recent rather dramatic weight gain.   He was initially referred in February 2009 for assessment Baker a chronic  painful shoulder.  In view Baker his history Baker diabetes and past  employment as an Public affairs consultant with [employer], he had signs  Baker AC  arthropathy and adhesive capsulitis.   We initially recommended examination Baker the shoulder under anesthesia  and possible arthroscopic capsulectomy.  Due to a number Baker family  challenges including the illness and subsequent death Baker his mother, Mr.  Baker postponed surgery until July 2009.   He returned from visiting his family in [place] and made  arrangements to proceed with surgery at this time.   Preoperatively, he was assessed by the Anesthesia Service at the [facility].  His preoperative laboratory studies including an O2  saturation on room air reported between 95% and 99%.   This finding has been questioned in that as soon as [name] was brought  into the operating room proper, we observed, on room air, an O2  saturation Baker 88%.   His preoperative laboratory parameters including EKG and labs were  within normal limits, except for his elevated blood glucose due to his  chronic diabetes.   [name] underwent an uncomplicated right shoulder arthroscopy on August 17, 2007, under right interscalene block.  His block was in effect for  nearly 20 hours.  On the morning Baker August 18, 2007, we noted that he had  significant difficulties maintaining Baker O2 saturation greater than 90%  on room air.  His average O2 saturation was in the range Baker 88%.  In  view Baker this predicament, a standing chest x-ray is obtained, which  documented minor atelectasis in the right lung field.  A hospitalist  medical consult was obtained with Dr. Lendell Caprice.  She graciously agreed  to evaluate [name], and I made arrangements to transfer him to the  [facility] for postoperative observation.   [name] was maintained on 2 L Baker nasal oxygen; and on this level Baker  oxygen support, he was noted to have O2 saturations in the range Baker 92-  94%.  He was a bit uncomfortable with ambulation on room air.  Therefore, a number Baker other studies were obtained.  An EKG  was obtained  that was noted to show normal sinus rhythm.  No strain pattern.  No  evidence Baker a pulmonary embolus or right heart impairment on the morning  Baker August 19, 2007.  Due to his slight air hunger and respiratory  symptoms, a chest CT was ordered.  However, due to his large size Baker a  body mass Baker greater than 40 and a measured weight Baker 355 pounds, he was  unable to be placed in the CT scanner.  In view Baker this, Dr. Lendell Caprice  and myself conferred and concluded that a V/Q scan was in order.  This  was obtained on August 20, 2007, interpreted by Dr. Gordan Payment.  There was  no evidence Baker a pulmonary embolism with normal perfusion and no  perfusion defects being noted.   Dr. Lendell Caprice concluded that more likely not Mr. Vahle had chronic  respiratory insufficiency.  My personal impression is that his  preoperative O2 saturation was specious, and in my sphere Baker observation  from the morning Baker August 17, 2007 through August 20, 2007, his room air O2  saturations were all noted to be in the range Baker 88-90%.   Dr. Lendell Caprice obtained an arterial blood gas on room air that  demonstrated a pH Baker 7.419, a PCO2 Baker 52, and a PO2 Baker 67.3.   The final conclusion was that Mr. Edmundson has chronic respiratory  insufficiency more likely than not aggravated by sleep apnea and rapid  recent weight gain.  In view Baker these circumstances, he was discharged  home in a stable condition with instructions to seek an immediate  consult for evaluation Baker sleep apnea and respiratory insufficiency and  an immediate consult for bariatric surgery consideration and other  counseling to help with weight loss.   He was discharged on August 20, 2007, after assessment by Dr. Lendell Caprice  with plans to return to see Dr. Teressa Senter in the office on August 21, 2007.      Katy Fitch Sypher, M.D.  Electronically Signed     RVS/MEDQ  D:  08/24/2007  T:  08/25/2007  Job:  [id number]

## 2010-06-12 NOTE — Op Note (Signed)
NAME:  [name], [name]                            ACCOUNT NO.:  [id number]   MEDICAL RECORD NO.:  [id number]                   PATIENT TYPE:  AMB   LOCATION:  DSC                                  FACILITY:  [facility]   PHYSICIAN:  Robert A. Thurston Hole, M.D.              DATE OF BIRTH:  November 21, 1955   DATE OF PROCEDURE:  DATE OF DISCHARGE:                                 OPERATIVE REPORT   PREOPERATIVE DIAGNOSES:  1. 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  2. Left elbow lateral epicondylitis.   POSTOPERATIVE DIAGNOSES:  1. 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  2. Left elbow lateral epicondylitis.   OPERATION:  1. Left shoulder examination under anesthesia followed by manipulation.  2. Left shoulder arthroscopic lysis of adhesions with subacromial     decompression.  3. Left elbow cortisone injection.   SURGEON:  Elana Alm. Thurston Hole, M.D.   ASSISTANT:  Gifford Shave, P.A.   ANESTHESIA:  General.   OPERATIVE TIME:  45 minutes.   COMPLICATIONS:  None.   INDICATIONS FOR PROCEDURE:  The patient is a 55 year old gentleman who has  had significant left shoulder and elbow pain for the past five to six  months, increasing in nature with signs and symptoms documenting rotator  cuff tendonitis and adhesive capsulitis as well as lateral epicondylitis and  is now to undergo examination under anesthesia, manipulation, arthroscopy,  and lateral epicondylar injection.   DESCRIPTION OF PROCEDURE:  The patient was brought to the operating room on  09/19/2001 after a supraclavicular block had been placed in the holding area.  He was placed on the operating room table in a supine position.  After being  placed under general anesthesia, his left shoulder was examined under  anesthesia.  He had forward flexion to 150, abduction 130, internal and  external rotation at 50 degrees.  A gentle manipulation was  carried out,  breaking up soft adhesions and improving forward flexion to 175, abduction  175, internal and external rotation of 90 degrees.  The shoulder remained  stable ligamentous exam.   The left elbow was examined under anesthesia.  He had full range of motion,  and is elbow was stable ligamentous exam.  The lateral epicondylar region  was injected with 1 cc of DepoMedrol 40 mg in 2 cc of 0.25% Marcaine mixed  together under sterile conditions, and this was tolerated well.   He was then placed in the beach chair position, and the shoulder and arm  were prepped using sterile Duraprep and draped using sterile technique.  Originally the arthroscopy was performed through a posterior arthroscopic  portal with an arthroscope where the pump test was placed into an anterior  portal, and arthroscopic flow was placed.  The articular surfaces on the  glenoid and humeral head were found to be  intact.  There was found to be  significant hemorrhagic synovitis throughout the shoulder secondary to  adhesive capsulitis.  The anterior and posterior labrum were intact.  The  inferior labrum and anterior and inferior glenohumeral ligament complex were  intact.  The supralabrum and biceps tendon anchor were intact. The biceps  tendon was intact.  The rotator cuff was thoroughly inspected.  There was  found to be no evidence of a tear.  The inferior capsule recess showed  significant hemorrhagic synovitis but no other pathology.   At this point, the arthroscopic instruments were placed in the subacromial  space, and a lateral portal was made.  Significant thickened bursitis was  resected.  Underneath this, the rotator cuff was then planed, but no  evidence of a tear noted. A subtotal bursectomy was carried out.  The  undersurface of the acromion showed a small spur in this region, and the  distal 4 to 5 mm were resected back to a stable rim.  CA ligament released  as well.  The [facility] joint was not  disturbed because he had a previous open  distal clavicle excision years ago, and this was still satisfactorily noted  on x-ray. After the decompression had been carried out, the should could be  run through a full range of motion with no impingement on the rotator cuff.  At this point, the arthroscopic instruments were removed.  The portals were  closed with 3-0 nylon suture and injected with 0.25% Marcaine with  epinephrine.  Sterile dressings were applied and a sling, and the patient  was awakened and taken to the recovery room in stable condition.   FOLLOWUP CARE:  The patient will be followed as an outpatient on Vicodin and  Naprosyn with early aggressive physical therapy.  See me back in the office  in a week for sutures out and followup.                                                Robert A. Thurston Hole, M.D.    RAW/MEDQ  D:  09/19/2001  T:  09/20/2001  Job:  [id number]

## 2010-10-22 ENCOUNTER — Telehealth (INDEPENDENT_AMBULATORY_CARE_PROVIDER_SITE_OTHER): Payer: Self-pay | Admitting: Surgery

## 2010-10-22 NOTE — Telephone Encounter (Signed)
10/22/10 Recall letter mailed to patient for bariatric surgery follow-up.  Adv pt to call to schedule appt...cef °

## 2010-10-23 LAB — BASIC METABOLIC PANEL
BUN: 12
CO2: 31
Chloride: 97
Creatinine, Ser: 0.65
Glucose, Bld: 276 — ABNORMAL HIGH
Potassium: 4.9

## 2010-10-23 LAB — POCT HEMOGLOBIN-HEMACUE: Hemoglobin: 14.9

## 2010-10-23 LAB — BLOOD GAS, ARTERIAL
Acid-Base Excess: 8.4 — ABNORMAL HIGH
Bicarbonate: 33.1 — ABNORMAL HIGH
TCO2: 34.7
pCO2 arterial: 52 — ABNORMAL HIGH
pH, Arterial: 7.419
pO2, Arterial: 67.3 — ABNORMAL LOW

## 2010-10-30 LAB — HEMOGLOBIN AND HEMATOCRIT, BLOOD
HCT: 43.2 % (ref 39.0–52.0)
Hemoglobin: 14.5 g/dL (ref 13.0–17.0)

## 2010-10-30 LAB — GLUCOSE, CAPILLARY
Glucose-Capillary: 197 mg/dL — ABNORMAL HIGH (ref 70–99)
Glucose-Capillary: 201 mg/dL — ABNORMAL HIGH (ref 70–99)
Glucose-Capillary: 202 mg/dL — ABNORMAL HIGH (ref 70–99)
Glucose-Capillary: 202 mg/dL — ABNORMAL HIGH (ref 70–99)
Glucose-Capillary: 338 mg/dL — ABNORMAL HIGH (ref 70–99)

## 2010-10-30 LAB — BASIC METABOLIC PANEL
CO2: 32 mEq/L (ref 19–32)
Chloride: 99 mEq/L (ref 96–112)
Creatinine, Ser: 0.62 mg/dL (ref 0.4–1.5)
GFR calc Af Amer: >60 mL/min (ref >60)
Sodium: 136 mEq/L (ref 135–145)

## 2011-10-14 ENCOUNTER — Telehealth (INDEPENDENT_AMBULATORY_CARE_PROVIDER_SITE_OTHER): Payer: Self-pay | Admitting: General Surgery

## 2011-10-14 NOTE — Telephone Encounter (Signed)
Pt called to ask for advice and appt.  Former pt os Dr. Colin Benton with lapband and has not been seen "in a long, long time."  He estimates approximately 50 lbs weight loss.  Today he complains of constipation and some vomiting of phlegm.  First advised him to stop solids and consume only fluids for next 48-72 hours.  Drink grape or prune juice, try MOM, begin Miralax (1-2 capfuls in 10 oz water QD-BID) and walk AMAP.  He understands and will comply.  Appt made with Dr. Daphine Deutscher for lap band follow-up.

## 2011-11-25 ENCOUNTER — Ambulatory Visit (INDEPENDENT_AMBULATORY_CARE_PROVIDER_SITE_OTHER): Payer: BC Managed Care – PPO | Admitting: Surgery

## 2011-12-15 ENCOUNTER — Other Ambulatory Visit: Payer: Self-pay | Admitting: Physician Assistant

## 2011-12-15 ENCOUNTER — Ambulatory Visit (HOSPITAL_COMMUNITY)
Admission: RE | Admit: 2011-12-15 | Discharge: 2011-12-15 | Disposition: A | Discharge status: Home / Self Care | Payer: BC Managed Care – PPO | Source: Ambulatory Visit | Attending: Physician Assistant | Admitting: Physician Assistant

## 2011-12-15 DIAGNOSIS — R1011 Right upper quadrant pain: Secondary | ICD-10-CM

## 2011-12-15 DIAGNOSIS — K7689 Other specified diseases of liver: Secondary | ICD-10-CM | POA: Insufficient documentation

## 2011-12-21 ENCOUNTER — Other Ambulatory Visit: Payer: Self-pay | Admitting: Family Medicine

## 2011-12-21 DIAGNOSIS — R1011 Right upper quadrant pain: Secondary | ICD-10-CM

## 2012-01-03 ENCOUNTER — Encounter (HOSPITAL_COMMUNITY)
Admission: RE | Admit: 2012-01-03 | Discharge: 2012-01-03 | Disposition: A | Discharge status: Home / Self Care | Payer: BC Managed Care – PPO | Source: Ambulatory Visit | Attending: Family Medicine | Admitting: Family Medicine

## 2012-01-03 DIAGNOSIS — R1011 Right upper quadrant pain: Secondary | ICD-10-CM | POA: Insufficient documentation

## 2012-01-03 MED ORDER — SINCALIDE 5 MCG IJ SOLR
INTRAMUSCULAR | Status: AC
  Administered 2012-01-03: 2.73 ug via INTRAVENOUS
  Filled 2012-01-03: qty 10

## 2012-01-03 MED ORDER — TECHNETIUM TC 99M MEBROFENIN IV KIT
5.0000 | PACK | Freq: Once | INTRAVENOUS | Status: AC | PRN
  Administered 2012-01-03: 5 via INTRAVENOUS

## 2012-01-03 MED ORDER — SINCALIDE 5 MCG IJ SOLR
0.0200 ug/kg | Freq: Once | INTRAMUSCULAR | Status: AC
  Administered 2012-01-03: 2.73 ug via INTRAVENOUS

## 2012-01-07 ENCOUNTER — Telehealth: Payer: Self-pay | Admitting: Gastroenterology

## 2012-01-07 ENCOUNTER — Ambulatory Visit (INDEPENDENT_AMBULATORY_CARE_PROVIDER_SITE_OTHER): Payer: BC Managed Care – PPO | Admitting: Surgery

## 2012-01-07 ENCOUNTER — Encounter (INDEPENDENT_AMBULATORY_CARE_PROVIDER_SITE_OTHER): Payer: Self-pay | Admitting: Surgery

## 2012-01-07 VITALS — BP 132/84 | HR 60 | Temp 98.7°F | Resp 18 | Ht 74.5 in | Wt 310.2 lb

## 2012-01-07 DIAGNOSIS — Z9989 Dependence on other enabling machines and devices: Secondary | ICD-10-CM | POA: Insufficient documentation

## 2012-01-07 DIAGNOSIS — E119 Type 2 diabetes mellitus without complications: Secondary | ICD-10-CM

## 2012-01-07 DIAGNOSIS — G4733 Obstructive sleep apnea (adult) (pediatric): Secondary | ICD-10-CM | POA: Insufficient documentation

## 2012-01-07 DIAGNOSIS — B1920 Unspecified viral hepatitis C without hepatic coma: Secondary | ICD-10-CM

## 2012-01-07 DIAGNOSIS — Z9884 Bariatric surgery status: Secondary | ICD-10-CM

## 2012-01-07 NOTE — Progress Notes (Signed)
[name] Body mass index is 39.29 kg/(m^2).  Having regurgitation:  no  Nocturnal reflux?  no  Amount of fill  0.5 [name] comes in today not having been seen since December 20 p.m. He's only really had tube and fills dating back to October 2010. He is an APL band in place but does get restriction particularly in the morning. He's had metabolic syndrome and he is seen Dr. Fransico Him in [place].  We talked about lowering carbohydrates in his diet and I went ahead and added 0.5 cc to his band. I will see him back in 2 months and hopefully we can help lower his weight. He have to get on some shots for hepatitis C.  Plan return 2 month

## 2012-01-07 NOTE — Patient Instructions (Signed)

## 2012-01-10 NOTE — Telephone Encounter (Signed)
I have left another message for Selena Batten at [employer].

## 2012-01-10 NOTE — Telephone Encounter (Signed)
Patient is having right upper quad pain.[name] He has had HIDA, [place], and recently seen by Dr. Timothy Lasso at [facility] ( notes from Dr. Timothy Lasso are not available ).  Patient will come in and see Doug Sou, PA 01/11/12 8:30

## 2012-01-11 ENCOUNTER — Ambulatory Visit (INDEPENDENT_AMBULATORY_CARE_PROVIDER_SITE_OTHER): Payer: BC Managed Care – PPO | Admitting: Gastroenterology

## 2012-01-11 ENCOUNTER — Encounter: Payer: Self-pay | Admitting: Gastroenterology

## 2012-01-11 VITALS — BP 164/78 | HR 84 | Ht 73.0 in | Wt 312.0 lb

## 2012-01-11 DIAGNOSIS — R071 Chest pain on breathing: Secondary | ICD-10-CM

## 2012-01-11 MED ORDER — OMEPRAZOLE 20 MG PO CPDR: 20.0000 mg | DELAYED_RELEASE_CAPSULE | Freq: Every day | ORAL | Status: DC

## 2012-01-11 NOTE — Patient Instructions (Addendum)
Please take ibuprofen as needed for pain as previously prescribed Please start on Prilosec OTC one daily while taking daily ibuprofen- you have been provided samples

## 2012-01-11 NOTE — Progress Notes (Signed)
01/11/2012 [name] [id number] 08/24/1955   HISTORY OF PRESENT ILLNESS:  Patient is a 56 year old male who presents to our office today with complaints of "RUQ abdominal pain".  When pointing to the location of the pain, he actually points very high on the right side, over his chest.  Pain has been present since August or September.  Described as stabbing pain at times.  Not related to eating or other GI symptoms.  Ultrasound of the RUQ showed only steatosis.  HIDA scan was normal.  CBC, amylase, lipase, and CMP were all unremarkable.  Had colonoscopy in 03/2007 with internal and external hemorrhoids only.  Has been taking NSAID's, which do seem to help.     Past Medical History  Diagnosis Date  . Arthritis   . Asthma   . Diabetes mellitus without complication   . Hyperlipidemia   . Hypertension   . Hepatitis C     GENOTYPE II-  . Cancer     melanoma  . Hiatal hernia   . GERD (gastroesophageal reflux disease)   . Depression   . Melanoma     ARM AND CHEST  . Hemorrhoids   . Perirectal abscess     I & D 03/2007  . Sleep apnea   . Obesity    Past Surgical History  Procedure Date  . Laparoscopic gastric banding 2010  . Rotator cuff repair 2010  . Rotator cuff repair 1989    left  . Hiatal hernia repair 1998  . Tonsillectomy   . Anal fissure repair     reports that he quit smoking about 29 years ago. He does not have any smokeless tobacco history on file. He reports that he does not drink alcohol or use illicit drugs. family history includes Brain cancer in his mother; CAD in an unspecified family member; CVA in his father; Colon cancer (age of onset:60) in his father; Diabetes in his brother, father, and paternal grandmother; Heart attack in his paternal grandfather; Leukemia in his paternal grandfather; Prostate cancer in his father; Stroke in his paternal grandfather; and Testicular cancer in his father. Allergies  Allergen Reactions  . Bee Venom Shortness Of Breath and  Anxiety  . Tape Rash    PLASTIC TAPE      Outpatient Encounter Prescriptions as of 01/11/2012  Medication Sig Dispense Refill  . Fluticasone-Salmeterol (ADVAIR) 250-50 MCG/DOSE AEPB Inhale 1 puff into the lungs as needed.      . insulin aspart (NOVOLOG) 100 UNIT/ML injection Inject into the skin as directed. SLIDING SCALE      . insulin glargine (LANTUS) 100 UNIT/ML injection Inject 60 Units into the skin at bedtime.         REVIEW OF SYSTEMS  : All other systems reviewed and negative except where noted in the History of Present Illness.   PHYSICAL EXAM: BP 164/78  Pulse 84  Ht 6\' 1"  (1.854 m)  Wt 312 lb (141.522 kg)  BMI 41.16 kg/m2 General: Well developed white male in no acute distress Head: Normocephalic and atraumatic Eyes:  Sclerae anicteric,conjunctive pink. Ears: Normal auditory acuity Lungs: Clear throughout to auscultation.  TTP on the right chest wall along the ribcage. Heart: Regular rate and rhythm Abdomen: Soft, obese,non-distended, non-ender.  Port from lab band felt in mid-abdomen.  Bowel sounds present. Musculoskeletal: Symmetrical with no gross deformities. Skin: No lesions on visible extremities. Neurological: Alert oriented x 4, grossly nonfocal Psychological:  Alert and cooperative. Normal mood and affect.  ASSESSMENT AND  PLAN: -RUQ pain:  This is not abdominal pain, but rather pain in the chest wall.  Likely costochondritis vs muscle related.  Recommend that he continues the dose of NSAID's that he is currently taking.  Will begin taking a PPI daily for the duration of the time that he is taking the NSAID's for gastric prophylaxis (some samples were given).

## 2012-01-11 NOTE — Progress Notes (Signed)
I agree with the plan outlined about.  Does not sound like gastrointestinal pain.

## 2012-02-14 ENCOUNTER — Encounter: Payer: Self-pay | Admitting: Gastroenterology

## 2012-03-08 ENCOUNTER — Encounter (INDEPENDENT_AMBULATORY_CARE_PROVIDER_SITE_OTHER): Payer: BC Managed Care – PPO | Admitting: Surgery

## 2012-03-23 ENCOUNTER — Encounter (INDEPENDENT_AMBULATORY_CARE_PROVIDER_SITE_OTHER): Payer: Self-pay

## 2012-03-23 ENCOUNTER — Ambulatory Visit (INDEPENDENT_AMBULATORY_CARE_PROVIDER_SITE_OTHER): Payer: BC Managed Care – PPO | Admitting: Physician Assistant

## 2012-03-23 VITALS — BP 134/84 | HR 68 | Temp 98.1°F | Ht 74.5 in | Wt 294.8 lb

## 2012-03-23 DIAGNOSIS — Z4651 Encounter for fitting and adjustment of gastric lap band: Secondary | ICD-10-CM

## 2012-03-23 NOTE — Patient Instructions (Signed)
Take clear liquids tonight. Thin protein shakes are ok to start tomorrow morning. Slowly advance your diet thereafter. Call us if you have persistent vomiting or regurgitation, night cough or reflux symptoms. Return as scheduled or sooner if you notice no changes in hunger/portion sizes.  

## 2012-03-23 NOTE — Progress Notes (Signed)
  HISTORY: [name] is a 57 y.o.male who received an AP-Large lap-band in March 2010 by Dr. Colin Benton. He comes in having last been seen in December and has lost an additional 15 lbs. He's enthusiastic to continue weight loss. He's exercising regularly and is being careful about intake. He wants an adjustment today as he's having increasing hunger but fortunately no untoward symptoms.  VITAL SIGNS: Filed Vitals:   03/23/12 1444  BP: 134/84  Pulse: 68  Temp: 98.1 F (36.7 C)    PHYSICAL EXAM: Physical exam reveals a very well-appearing 56 y.o.male in no apparent distress Neurologic: Awake, alert, oriented Psych: Bright affect, conversant Respiratory: Breathing even and unlabored. No stridor or wheezing Abdomen: Soft, nontender, nondistended to palpation. Incisions well-healed. No incisional hernias. Port easily palpated. Extremities: Atraumatic, good range of motion.  ASSESMENT: 57 y.o.  male  s/p AP-Large lap-band.   PLAN: The patient's port was accessed with a 20G Huber needle without difficulty. Clear fluid was aspirated and 0.5 mL saline was added to the port. The patient was able to swallow water without difficulty following the procedure and was instructed to take clear liquids for the next 24-48 hours and advance slowly as tolerated.

## 2012-03-30 ENCOUNTER — Encounter (INDEPENDENT_AMBULATORY_CARE_PROVIDER_SITE_OTHER): Payer: Self-pay

## 2012-05-18 ENCOUNTER — Ambulatory Visit (INDEPENDENT_AMBULATORY_CARE_PROVIDER_SITE_OTHER): Payer: BC Managed Care – PPO | Admitting: Physician Assistant

## 2012-05-18 ENCOUNTER — Encounter (INDEPENDENT_AMBULATORY_CARE_PROVIDER_SITE_OTHER): Payer: Self-pay

## 2012-05-18 VITALS — BP 130/82 | Ht 74.5 in | Wt 288.0 lb

## 2012-05-18 DIAGNOSIS — Z4651 Encounter for fitting and adjustment of gastric lap band: Secondary | ICD-10-CM

## 2012-05-18 NOTE — Patient Instructions (Signed)
Take clear liquids tonight. Thin protein shakes are ok to start tomorrow morning. Slowly advance your diet thereafter. Call us if you have persistent vomiting or regurgitation, night cough or reflux symptoms. Return as scheduled or sooner if you notice no changes in hunger/portion sizes.  

## 2012-05-18 NOTE — Progress Notes (Signed)
  HISTORY: [name] is a 57 y.o.male who received an AP-Large lap-band in March 2010 by Dr. Colin Benton. He comes in with 6 lbs weight loss in 2 months. He has no persistent regurgitation or reflux. His hunger is under pretty good control but he would like a fill today to continue his weight loss.  VITAL SIGNS: Filed Vitals:   05/18/12 1446  BP: 130/82    PHYSICAL EXAM: Physical exam reveals a very well-appearing 56 y.o.male in no apparent distress Neurologic: Awake, alert, oriented Psych: Bright affect, conversant Respiratory: Breathing even and unlabored. No stridor or wheezing Abdomen: Soft, nontender, nondistended to palpation. Incisions well-healed. No incisional hernias. Port easily palpated. Extremities: Atraumatic, good range of motion.  ASSESMENT: 57 y.o.  male  s/p AP-Large lap-band.   PLAN: The patient's port was accessed with a 20G Huber needle without difficulty. Clear fluid was aspirated and 0.5 mL saline was added to the port. The patient was able to swallow water without difficulty following the procedure and was instructed to take clear liquids for the next 24-48 hours and advance slowly as tolerated.

## 2012-07-13 ENCOUNTER — Telehealth (INDEPENDENT_AMBULATORY_CARE_PROVIDER_SITE_OTHER): Payer: Self-pay | Admitting: General Surgery

## 2012-07-13 ENCOUNTER — Encounter (INDEPENDENT_AMBULATORY_CARE_PROVIDER_SITE_OTHER): Payer: BC Managed Care – PPO

## 2012-07-13 NOTE — Telephone Encounter (Signed)
I called patient at [employer] and Work number listed, to find the reason he was a NS. His [employer] # went to voicemail and his Work # said there was nobody there by that name.

## 2012-07-17 ENCOUNTER — Telehealth: Payer: Self-pay | Admitting: General Practice

## 2012-07-17 NOTE — Telephone Encounter (Signed)
appt changed

## 2012-07-18 ENCOUNTER — Ambulatory Visit (INDEPENDENT_AMBULATORY_CARE_PROVIDER_SITE_OTHER): Payer: BC Managed Care – PPO

## 2012-07-18 ENCOUNTER — Ambulatory Visit (INDEPENDENT_AMBULATORY_CARE_PROVIDER_SITE_OTHER): Payer: BC Managed Care – PPO | Admitting: General Practice

## 2012-07-18 ENCOUNTER — Ambulatory Visit: Payer: Self-pay | Admitting: General Practice

## 2012-07-18 ENCOUNTER — Encounter: Payer: Self-pay | Admitting: General Practice

## 2012-07-18 VITALS — BP 152/75 | HR 66 | Temp 97.6°F | Ht 74.0 in | Wt 278.0 lb

## 2012-07-18 DIAGNOSIS — M549 Dorsalgia, unspecified: Secondary | ICD-10-CM

## 2012-07-18 DIAGNOSIS — R079 Chest pain, unspecified: Secondary | ICD-10-CM

## 2012-07-18 DIAGNOSIS — R9431 Abnormal electrocardiogram [ECG] [EKG]: Secondary | ICD-10-CM

## 2012-07-18 NOTE — Patient Instructions (Addendum)
Back Pain, Adult  Low back pain is very common. About 1 in 5 people have back pain. The cause of low back pain is rarely dangerous. The pain often gets better over time. About half of people with a sudden onset of back pain feel better in just 2 weeks. About 8 in 10 people feel better by 6 weeks.   CAUSES  Some common causes of back pain include:  · Strain of the muscles or ligaments supporting the spine.  · Wear and tear (degeneration) of the spinal discs.  · Arthritis.  · Direct injury to the back.  DIAGNOSIS  Most of the time, the direct cause of low back pain is not known. However, back pain can be treated effectively even when the exact cause of the pain is unknown. Answering your caregiver's questions about your overall health and symptoms is one of the most accurate ways to make sure the cause of your pain is not dangerous. If your caregiver needs more information, he or she may order lab work or imaging tests (X-rays or MRIs). However, even if imaging tests show changes in your back, this usually does not require surgery.  HOME CARE INSTRUCTIONS  For many people, back pain returns. Since low back pain is rarely dangerous, it is often a condition that people can learn to manage on their own.   · Remain active. It is stressful on the back to sit or stand in one place. Do not sit, drive, or stand in one place for more than 30 minutes at a time. Take short walks on level surfaces as soon as pain allows. Try to increase the length of time you walk each day.  · Do not stay in bed. Resting more than 1 or 2 days can delay your recovery.  · Do not avoid exercise or work. Your body is made to move. It is not dangerous to be active, even though your back may hurt. Your back will likely heal faster if you return to being active before your pain is gone.  · Pay attention to your body when you  bend and lift. Many people have less discomfort when lifting if they bend their knees, keep the load close to their bodies, and  avoid twisting. Often, the most comfortable positions are those that put less stress on your recovering back.  · Find a comfortable position to sleep. Use a firm mattress and lie on your side with your knees slightly bent. If you lie on your back, put a pillow under your knees.  · Only take over-the-counter or prescription medicines as directed by your caregiver. Over-the-counter medicines to reduce pain and inflammation are often the most helpful. Your caregiver may prescribe muscle relaxant drugs. These medicines help dull your pain so you can more quickly return to your normal activities and healthy exercise.  · Put ice on the injured area.  · Put ice in a plastic bag.  · Place a towel between your skin and the bag.  · Leave the ice on for 15-20 minutes, 3-4 times a day for the first 2 to 3 days. After that, ice and heat may be alternated to reduce pain and spasms.  · Ask your caregiver about trying back exercises and gentle massage. This may be of some benefit.  · Avoid feeling anxious or stressed. Stress increases muscle tension and can worsen back pain. It is important to recognize when you are anxious or stressed and learn ways to manage it. Exercise is a great option.  SEEK MEDICAL CARE IF:  · You have pain that is not relieved with rest or   medicine.  · You have pain that does not improve in 1 week.  · You have new symptoms.  · You are generally not feeling well.  SEEK IMMEDIATE MEDICAL CARE IF:   · You have pain that radiates from your back into your legs.  · You develop new bowel or bladder control problems.  · You have unusual weakness or numbness in your arms or legs.  · You develop nausea or vomiting.  · You develop abdominal pain.  · You feel faint.  Document Released: 01/11/2005 Document Revised: 07/13/2011 Document Reviewed: 06/01/2010  Exit[employer]® Patient Information ©2014 [employer].

## 2012-07-18 NOTE — Progress Notes (Signed)
  Subjective:    Patient ID: [name], male    DOB: 04/10/1955, 57 y.o.   MRN: [id number]  Back Pain This is a new problem. The current episode started 1 to 4 weeks ago. The problem occurs constantly. The problem has been gradually worsening since onset. The pain is present in the thoracic spine. The quality of the pain is described as aching. Radiates to: right arm and chest. The pain is at a severity of 7/10. The symptoms are aggravated by twisting and sitting. Stiffness is present all day. Associated symptoms include chest pain. Pertinent negatives include no abdominal pain, bladder incontinence, bowel incontinence, dysuria, fever, headaches, numbness, tingling or weakness. He has tried NSAIDs for the symptoms. The treatment provided mild relief.  He denies known trauma.   Review of Systems  Constitutional: Negative for fever and chills.  Respiratory: Negative for chest tightness and shortness of breath.   Cardiovascular: Positive for chest pain. Negative for palpitations and leg swelling.  Gastrointestinal: Negative for abdominal pain, blood in stool and bowel incontinence.  Genitourinary: Negative for bladder incontinence, dysuria and hematuria.  Musculoskeletal: Positive for back pain.  Neurological: Negative for tingling, weakness, numbness and headaches.       Objective:   Physical Exam  Constitutional: He is oriented to person, place, and time. He appears well-developed and well-nourished.  HENT:  Head: Normocephalic and atraumatic.  Right Ear: External ear normal.  Left Ear: External ear normal.  Cardiovascular: Normal rate, regular rhythm and normal heart sounds.   Pulmonary/Chest: Effort normal and breath sounds normal. No respiratory distress. He exhibits no tenderness.  Abdominal: Soft. Bowel sounds are normal. He exhibits no distension. There is no tenderness.  Neurological: He is alert and oriented to person, place, and time.  Skin: Skin is warm and dry.  Psychiatric:  He has a normal mood and affect.          Assessment & Plan:  1. Back pain - DG Thoracic Spine 2 View - MR Thoracic Spine Wo Contrast; Future -Rest -Discussed pain management of ibuprofen and ultram, but patient refused to try these medications, he verbalized needing something with codeine in it. -heat pack to affected area for 10-15 minutes, 45 minutes off -RTO to office if symptoms worsen  2. Chest pain, unspecified - EKG 12-Lead  3. Nonspecific abnormal electrocardiogram (ECG) (EKG) - Ambulatory referral to Cardiology -seek emergency medical attention if symptoms worsen -Patient verbalized understanding -Coralie Keens, FNP-C

## 2012-07-27 ENCOUNTER — Ambulatory Visit (INDEPENDENT_AMBULATORY_CARE_PROVIDER_SITE_OTHER): Payer: BC Managed Care – PPO | Admitting: Physician Assistant

## 2012-07-27 ENCOUNTER — Encounter (INDEPENDENT_AMBULATORY_CARE_PROVIDER_SITE_OTHER): Payer: Self-pay

## 2012-07-27 DIAGNOSIS — R0789 Other chest pain: Secondary | ICD-10-CM

## 2012-07-27 DIAGNOSIS — Z9884 Bariatric surgery status: Secondary | ICD-10-CM

## 2012-07-27 DIAGNOSIS — R071 Chest pain on breathing: Secondary | ICD-10-CM

## 2012-07-27 MED ORDER — TRAMADOL HCL 50 MG PO TABS: 50.0000 mg | ORAL_TABLET | Freq: Four times a day (QID) | ORAL | Status: DC | PRN

## 2012-07-27 NOTE — Patient Instructions (Signed)
Return in one month. Focus on good food choices as well as physical activity. Return sooner if you have an increase in hunger, portion sizes or weight. Return also for difficulty swallowing, night cough, reflux. Take ultram as prescribed for your right sided pain. Follow-up with your primary care doctor next week.

## 2012-07-27 NOTE — Progress Notes (Signed)
  HISTORY: [name] is a 57 y.o.male who received an AP-Large lap-band in March 2010 by Dr. Colin Benton. He comes in with no complaints of hunger or larger than desired portion sizes. He also denies regurgitation or reflux symptoms. He does report onset of right sided chest wall pain radiating from the sternum, around the lower costal margin toward the spine. He denies left sided pain altogether. He describes the pain as burning in nature and causes increased right sided axillary sweating. He reports having an EKG recently for this which revealed no obvious cardiac abnormality. The pain is keeping him from sleeping. He is unsure if this is being caused by the band, and as such, is reluctant to have a fill today. He reports being scheduled for an MRI.  VITAL SIGNS: Filed Vitals:   07/27/12 1353  BP: 140/88  Pulse: 70  Temp: 97.3 F (36.3 C)  Resp: 14    PHYSICAL EXAM: Physical exam reveals a very well-appearing 56 y.o.male in no apparent distress Neurologic: Awake, alert, oriented Psych: Bright affect, conversant Respiratory: Breathing even and unlabored. No stridor or wheezing Extremities: Atraumatic, good range of motion. Skin: Warm, Dry, no rashes. Some cutaneous tenderness running along the right costal margin from xiphoid area toward the spine. No rashes or vesicles seen. Musculoskeletal: Normal gait, Joints normal Abdomen: soft, nontender, nondistended. Port is in good position. No incisional hernia.  ASSESMENT: 57 y.o.  male  s/p AP-Large lap-band.   PLAN: We deferred a fill today due to his being in the green zone and the fact that his pain is not fully characterized. Given its distribution and character, it wouldn't surprise me if he has shingles, but I've directed him to contact his primary care physician next week for an appointment. In the meantime I've prescribed ultram to manage his pain and to hopefully allow him some sleep.

## 2012-07-31 ENCOUNTER — Telehealth: Payer: Self-pay | Admitting: Family Medicine

## 2012-07-31 NOTE — Telephone Encounter (Signed)
Appt given for tomorrow per pt request 

## 2012-08-01 ENCOUNTER — Ambulatory Visit (INDEPENDENT_AMBULATORY_CARE_PROVIDER_SITE_OTHER): Payer: BC Managed Care – PPO | Admitting: Family Medicine

## 2012-08-01 ENCOUNTER — Ambulatory Visit (HOSPITAL_COMMUNITY): Payer: BC Managed Care – PPO

## 2012-08-01 ENCOUNTER — Encounter: Payer: Self-pay | Admitting: Family Medicine

## 2012-08-01 VITALS — BP 149/70 | HR 73 | Temp 97.4°F | Wt 280.2 lb

## 2012-08-01 DIAGNOSIS — G4733 Obstructive sleep apnea (adult) (pediatric): Secondary | ICD-10-CM

## 2012-08-01 DIAGNOSIS — Z9884 Bariatric surgery status: Secondary | ICD-10-CM

## 2012-08-01 DIAGNOSIS — B1920 Unspecified viral hepatitis C without hepatic coma: Secondary | ICD-10-CM

## 2012-08-01 DIAGNOSIS — IMO0002 Reserved for concepts with insufficient information to code with codable children: Secondary | ICD-10-CM

## 2012-08-01 DIAGNOSIS — M792 Neuralgia and neuritis, unspecified: Secondary | ICD-10-CM | POA: Insufficient documentation

## 2012-08-01 DIAGNOSIS — E119 Type 2 diabetes mellitus without complications: Secondary | ICD-10-CM

## 2012-08-01 MED ORDER — ACYCLOVIR 800 MG PO TABS: 800.0000 mg | ORAL_TABLET | Freq: Every day | ORAL | Status: DC

## 2012-08-01 MED ORDER — TRAMADOL HCL 50 MG PO TABS: 100.0000 mg | ORAL_TABLET | Freq: Four times a day (QID) | ORAL | Status: DC | PRN

## 2012-08-01 MED ORDER — GABAPENTIN 300 MG PO CAPS: 300.0000 mg | ORAL_CAPSULE | Freq: Three times a day (TID) | ORAL | Status: DC

## 2012-08-01 NOTE — Progress Notes (Signed)
Patient ID: [name], male   DOB: 05/03/1955, 58 y.o.   MRN: [id number] SUBJECTIVE: CC: Chief Complaint  Patient presents with  . Acute Visit    ?shingles c/o pain rt upper side.     HPI: Pain along the right chest with the skin burning.and into the axilla. The surgeon at [facility] who did the lap band surgery thinks it is due to shingles. Pain is lightning and burning. No fever. Patient known to me from [facility]. Sees Dr Fransico Him for his  DM. Doing better.lost over 20 lbs  So far. No other neurologis  Symptoms.  Past Medical History  Diagnosis Date  . Arthritis   . Asthma   . Diabetes mellitus without complication   . Hyperlipidemia   . Hypertension   . Hepatitis C     GENOTYPE II-  . Cancer     melanoma  . Hiatal hernia   . GERD (gastroesophageal reflux disease)   . Depression   . Melanoma     ARM AND CHEST  . Hemorrhoids   . Perirectal abscess     I & D 03/2007  . Sleep apnea   . Obesity    Past Surgical History  Procedure Laterality Date  . Laparoscopic gastric banding  2010  . Rotator cuff repair  2010  . Rotator cuff repair  1989    left  . Hiatal hernia repair  1998  . Tonsillectomy    . Anal fissure repair     History   Social History  . Marital Status: Married    Spouse Name: Deryl    Number of Children: 2  . Years of Education: N/A   Occupational History  . 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 Smoking status: Former Smoker    Quit date: 01/25/1982  . Smokeless tobacco: Not on file  . Alcohol Use: No  . Drug Use: No  . Sexually Active:    Other Topics Concern  . Not on file   Social History Narrative  . No narrative on file   Family History  Problem Relation Age of Onset  . Brain cancer Mother   . Colon cancer Father 24  . Heart attack Paternal Grandfather   . Diabetes Father   . Leukemia Paternal Grandfather   . CVA Father   . CAD    . Prostate cancer Father   . Testicular cancer  Father   . Diabetes Brother   . Diabetes Paternal Grandmother   . Stroke Paternal Grandfather    Current Outpatient Prescriptions on File Prior to Visit  Medication Sig Dispense Refill  . Fluticasone-Salmeterol (ADVAIR) 250-50 MCG/DOSE AEPB Inhale 1 puff into the lungs as needed.      . insulin aspart (NOVOLOG) 100 UNIT/ML injection Inject into the skin as directed. SLIDING SCALE      . insulin glargine (LANTUS) 100 UNIT/ML injection Inject 60 Units into the skin at bedtime.      . traMADol (ULTRAM) 50 MG tablet Take 1 tablet (50 mg total) by mouth every 6 (six) hours as needed for pain.  20 tablet  0  . ibuprofen (ADVIL,MOTRIN) 200 MG tablet Take 200 mg by mouth every 6 (six) hours as needed.      Marland Kitchen omeprazole (PRILOSEC) 20 MG capsule Take 1 capsule (20 mg total) by mouth daily.  60 capsule  0   No current facility-administered medications on file prior to visit.  Allergies  Allergen Reactions  . Bee Venom Shortness Of Breath and Anxiety  . Tape Rash    PLASTIC TAPE    There is no immunization history on file for this patient. Prior to Admission medications   Medication Sig Start Date End Date Taking? Authorizing Provider  Fluticasone-Salmeterol (ADVAIR) 250-50 MCG/DOSE AEPB Inhale 1 puff into the lungs as needed.   Yes Historical Provider, MD  insulin aspart (NOVOLOG) 100 UNIT/ML injection Inject into the skin as directed. SLIDING SCALE   Yes Historical Provider, MD  insulin glargine (LANTUS) 100 UNIT/ML injection Inject 60 Units into the skin at bedtime.   Yes Historical Provider, MD  traMADol (ULTRAM) 50 MG tablet Take 1 tablet (50 mg total) by mouth every 6 (six) hours as needed for pain. 07/27/12 07/27/13 Yes Andy Liepins, PA-C  acyclovir (ZOVIRAX) 800 MG tablet Take 1 tablet (800 mg total) by mouth 5 (five) times daily. 08/01/12   Ileana Ladd, MD  gabapentin (NEURONTIN) 300 MG capsule Take 1 capsule (300 mg total) by mouth 3 (three) times daily. Please  Start with one tablet daily  for2 days then  One tablet twice  A  Day for 2 days then go to one tablet 3 times  Daily. 08/01/12   Ileana Ladd, MD  ibuprofen (ADVIL,MOTRIN) 200 MG tablet Take 200 mg by mouth every 6 (six) hours as needed.    Historical Provider, MD  omeprazole (PRILOSEC) 20 MG capsule Take 1 capsule (20 mg total) by mouth daily. 01/11/12 01/10/13  Shanda Bumps D. Zehr, PA-C  traMADol (ULTRAM) 50 MG tablet Take 2 tablets (100 mg total) by mouth every 6 (six) hours as needed for pain. 08/01/12   Ileana Ladd, MD   ROS: As above in the HPI. All other systems are stable or negative.  OBJECTIVE: APPEARANCE:  Patient in no acute distress.The patient appeared well nourished and normally developed. Acyanotic. Waist: VITAL SIGNS:BP 149/70  Pulse 73  Temp(Src) 97.4 F (36.3 C) (Oral)  Wt 280 lb 3.2 oz (127.098 kg)  BMI 35.96 kg/m2 Obese WM. Uncomfortable.  SKIN: warm and  Dry without overt rashes, tattoos and scars. T6 dysesthesia. On the right trunk.  HEAD and Neck: without JVD, Head and scalp: normal Eyes:No scleral icterus. Fundi normal, eye movements normal. Ears: Auricle normal, canal normal, Tympanic membranes normal, insufflation normal. Nose: normal Throat: normal Neck & thyroid: normal  CHEST & LUNGS: Chest wall: normal Lungs: Clear  CVS: Reveals the PMI to be normally located. Regular rhythm, First and Second Heart sounds are normal,  absence of murmurs, rubs or gallops. Peripheral vasculature: Radial pulses: normal Dorsal pedis pulses: normal Posterior pulses: normal  ABDOMEN:  Appearance: obese Benign, no organomegaly, no masses, no Abdominal Aortic enlargement. No Guarding , no rebound. No Bruits. Bowel sounds: normal  RECTAL: N/A GU: N/A  EXTREMETIES: nonedematous.  MUSCULOSKELETAL:  Spine: normal Joints: intact  NEUROLOGIC: oriented to time,place and person; nonfocal. Strength is normal Sensory is abnormal: dysesthesia on the right T6 dermatome. Reflexes are  normal Cranial Nerves are normal.  ASSESSMENT: Neuralgia and neuritis - Plan: traMADol (ULTRAM) 50 MG tablet, acyclovir (ZOVIRAX) 800 MG tablet, gabapentin (NEURONTIN) 300 MG capsule  OSA on CPAP  Lapband APL March 2010  Hepatitis C, without hepatic coma  DM (diabetes mellitus)   PLAN: He either has a herpetic neuralgia or DM peripheral neuralgia.  No orders of the defined types were placed in this encounter.   Meds ordered this encounter  Medications  . traMADol (  ULTRAM) 50 MG tablet    Sig: Take 2 tablets (100 mg total) by mouth every 6 (six) hours as needed for pain.    Dispense:  30 tablet    Refill:  0  . acyclovir (ZOVIRAX) 800 MG tablet    Sig: Take 1 tablet (800 mg total) by mouth 5 (five) times daily.    Dispense:  35 tablet    Refill:  0  . gabapentin (NEURONTIN) 300 MG capsule    Sig: Take 1 capsule (300 mg total) by mouth 3 (three) times daily. Please  Start with one tablet daily for2 days then  One tablet twice  A  Day for 2 days then go to one tablet 3 times  Daily.    Dispense:  90 capsule    Refill:  3   Return in about 2 weeks (around 08/15/2012) for Recheck medical problems.  Mechel Schutter P. [place]esto Charon, M.D.

## 2012-08-11 ENCOUNTER — Other Ambulatory Visit: Payer: Self-pay | Admitting: Family Medicine

## 2012-08-11 ENCOUNTER — Telehealth: Payer: Self-pay | Admitting: Family Medicine

## 2012-08-11 DIAGNOSIS — M792 Neuralgia and neuritis, unspecified: Secondary | ICD-10-CM

## 2012-08-11 MED ORDER — TRAMADOL HCL 50 MG PO TABS: 100.0000 mg | ORAL_TABLET | Freq: Four times a day (QID) | ORAL | Status: DC | PRN

## 2012-08-11 MED ORDER — GABAPENTIN 300 MG PO CAPS: 600.0000 mg | ORAL_CAPSULE | Freq: Three times a day (TID) | ORAL | Status: DC

## 2012-08-11 NOTE — Telephone Encounter (Signed)
Pt aware to pick up script and he stated his wife will pick up and she has different last name   --[name] is her name

## 2012-08-11 NOTE — Telephone Encounter (Signed)
Doubled the gabapentin. For the nerve pain. Tell him it may not be shingles burt DM neuropathy. I refilled the tramadol once. If this doesn't work he needs referral to pain clinic for evaluation.

## 2012-08-14 ENCOUNTER — Ambulatory Visit (INDEPENDENT_AMBULATORY_CARE_PROVIDER_SITE_OTHER): Payer: BC Managed Care – PPO | Admitting: Family Medicine

## 2012-08-14 ENCOUNTER — Encounter: Payer: Self-pay | Admitting: Family Medicine

## 2012-08-14 VITALS — BP 132/71 | HR 67 | Temp 98.2°F | Ht 74.0 in | Wt 283.0 lb

## 2012-08-14 DIAGNOSIS — E669 Obesity, unspecified: Secondary | ICD-10-CM

## 2012-08-14 DIAGNOSIS — E119 Type 2 diabetes mellitus without complications: Secondary | ICD-10-CM

## 2012-08-14 DIAGNOSIS — M792 Neuralgia and neuritis, unspecified: Secondary | ICD-10-CM

## 2012-08-14 DIAGNOSIS — IMO0002 Reserved for concepts with insufficient information to code with codable children: Secondary | ICD-10-CM

## 2012-08-14 DIAGNOSIS — E785 Hyperlipidemia, unspecified: Secondary | ICD-10-CM

## 2012-08-14 LAB — POCT CBC
Granulocyte percent: 73.8 %G (ref 37–80)
HCT, POC: 43.4 % — AB (ref 43.5–53.7)
Hemoglobin: 14.9 g/dL (ref 14.1–18.1)
Lymph, poc: 2 (ref 0.6–3.4)
MCH, POC: 29.8 pg (ref 27–31.2)
MCHC: 34.2 g/dL (ref 31.8–35.4)
MCV: 86.9 fL (ref 80–97)
MPV: 8.8 fL (ref 0–99.8)
POC Granulocyte: 6.3 (ref 2–6.9)
POC LYMPH PERCENT: 22.9 %L (ref 10–50)
Platelet Count, POC: 173 10*3/uL (ref 142–424)
RBC: 5 M/uL (ref 4.69–6.13)
RDW, POC: 12.9 %
WBC: 8.6 10*3/uL (ref 4.6–10.2)

## 2012-08-14 LAB — POCT GLYCOSYLATED HEMOGLOBIN (HGB A1C): Hemoglobin A1C: 8.3

## 2012-08-14 MED ORDER — GABAPENTIN 400 MG PO CAPS: 800.0000 mg | ORAL_CAPSULE | Freq: Three times a day (TID) | ORAL | Status: DC

## 2012-08-14 MED ORDER — LIDOCAINE 5 % EX PTCH: 2.0000 | MEDICATED_PATCH | CUTANEOUS | Status: AC

## 2012-08-14 NOTE — Progress Notes (Signed)
Patient ID: [name], male   DOB: Jan 17, 1956, 57 y.o.   MRN: [id number] SUBJECTIVE: CC: Chief Complaint  Patient presents with  . Follow-up    pain still continues in arm thru chest. pain and numbness rt upper arm    HPI: Pain along C8 to T2 dermatome on the right side.no rashes. No fever. No change with the gabapentin. The pain is tolerable with the tramadol. No anginal pain.  Past Medical History  Diagnosis Date  . Arthritis   . Asthma   . Diabetes mellitus without complication   . Hyperlipidemia   . Hypertension   . Hepatitis C     GENOTYPE II-  . Cancer     melanoma  . Hiatal hernia   . GERD (gastroesophageal reflux disease)   . Depression   . Melanoma     ARM AND CHEST  . Hemorrhoids   . Perirectal abscess     I & D 03/2007  . Sleep apnea   . Obesity    Past Surgical History  Procedure Laterality Date  . Laparoscopic gastric banding  2010  . Rotator cuff repair  2010  . Rotator cuff repair  1989    left  . Hiatal hernia repair  1998  . Tonsillectomy    . Anal fissure repair     History   Social History  . Marital Status: Married    Spouse Name: Deryl    Number of Children: 2  . Years of Education: N/A   Occupational History  . 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 Smoking status: Former Smoker    Quit date: 01/25/1982  . Smokeless tobacco: Not on file  . Alcohol Use: No  . Drug Use: No  . Sexually Active:    Other Topics Concern  . Not on file   Social History Narrative  . No narrative on file   Family History  Problem Relation Age of Onset  . Brain cancer Mother   . Colon cancer Father 65  . Heart attack Paternal Grandfather   . Diabetes Father   . Leukemia Paternal Grandfather   . CVA Father   . CAD    . Prostate cancer Father   . Testicular cancer Father   . Diabetes Brother   . Diabetes Paternal Grandmother   . Stroke Paternal Grandfather    Current Outpatient  Prescriptions on File Prior to Visit  Medication Sig Dispense Refill  . insulin aspart (NOVOLOG) 100 UNIT/ML injection Inject into the skin as directed. SLIDING SCALE      . insulin glargine (LANTUS) 100 UNIT/ML injection Inject 60 Units into the skin at bedtime.      Marland Kitchen omeprazole (PRILOSEC) 20 MG capsule Take 1 capsule (20 mg total) by mouth daily.  60 capsule  0  . traMADol (ULTRAM) 50 MG tablet Take 2 tablets (100 mg total) by mouth every 6 (six) hours as needed for pain.  30 tablet  0  . acyclovir (ZOVIRAX) 800 MG tablet Take 1 tablet (800 mg total) by mouth 5 (five) times daily.  35 tablet  0  . Fluticasone-Salmeterol (ADVAIR) 250-50 MCG/DOSE AEPB Inhale 1 puff into the lungs as needed.      Marland Kitchen ibuprofen (ADVIL,MOTRIN) 200 MG tablet Take 200 mg by mouth every 6 (six) hours as needed.       No current facility-administered medications on file prior to visit.   Allergies  Allergen  Reactions  . Bee Venom Shortness Of Breath and Anxiety  . Tape Rash    PLASTIC TAPE    There is no immunization history on file for this patient. Prior to Admission medications   Medication Sig Start Date End Date Taking? Authorizing Provider  gabapentin (NEURONTIN) 400 MG capsule Take 2 capsules (800 mg total) by mouth 3 (three) times daily. 08/14/12  Yes Ileana Ladd, MD  insulin aspart (NOVOLOG) 100 UNIT/ML injection Inject into the skin as directed. SLIDING SCALE   Yes Historical Provider, MD  insulin glargine (LANTUS) 100 UNIT/ML injection Inject 60 Units into the skin at bedtime.   Yes Historical Provider, MD  omeprazole (PRILOSEC) 20 MG capsule Take 1 capsule (20 mg total) by mouth daily. 01/11/12 01/10/13 Yes Jessica D. Zehr, PA-C  traMADol (ULTRAM) 50 MG tablet Take 2 tablets (100 mg total) by mouth every 6 (six) hours as needed for pain. 08/11/12  Yes Ileana Ladd, MD  acyclovir (ZOVIRAX) 800 MG tablet Take 1 tablet (800 mg total) by mouth 5 (five) times daily. 08/01/12   Ileana Ladd, MD   Fluticasone-Salmeterol (ADVAIR) 250-50 MCG/DOSE AEPB Inhale 1 puff into the lungs as needed.    Historical Provider, MD  ibuprofen (ADVIL,MOTRIN) 200 MG tablet Take 200 mg by mouth every 6 (six) hours as needed.    Historical Provider, MD  lidocaine (LIDODERM) 5 % Place 2 patches onto the skin daily. Remove & Discard patch within 12 hours or as directed by MD 08/14/12   Ileana Ladd, MD     ROS: As above in the HPI. All other systems are stable or negative.  OBJECTIVE:  APPEARANCE:  Patient in no acute distress.The patient appeared well nourished and normally developed. Acyanotic. Waist: VITAL SIGNS:BP 132/71  Pulse 67  Temp(Src) 98.2 F (36.8 C) (Oral)  Ht 6\' 2"  (1.88 m)  Wt 283 lb (128.368 kg)  BMI 36.32 kg/m2 WM obese.  SKIN: warm and  Dry without overt rashes, tattoos and scars  HEAD and Neck: without JVD, Head and scalp: normal Eyes:No scleral icterus. Fundi normal, eye movements normal. Ears: Auricle normal, canal normal, Tympanic membranes normal, insufflation normal. Nose: normal Throat: normal Neck & thyroid: normal  CHEST & LUNGS: Chest wall: normal Lungs: Clear  CVS: Reveals the PMI to be normally located. Regular rhythm, First and Second Heart sounds are normal,  absence of murmurs, rubs or gallops. Peripheral vasculature: Radial pulses: normal Dorsal pedis pulses: normal Posterior pulses: normal  ABDOMEN:  Appearance: normal Benign, no organomegaly, no masses, no Abdominal Aortic enlargement. No Guarding , no rebound. No Bruits. Bowel sounds: normal  RECTAL: N/A GU: N/A  EXTREMETIES: nonedematous. Both Femoral and Pedal pulses are normal.  MUSCULOSKELETAL:  Spine: normal Joints: intact  NEUROLOGIC: oriented to time,place and person; nonfocal. Strength is normal Sensory is abnormal. There is hyperesthesia along C8 to T2 dermatome.  Reflexes are normal Cranial Nerves are normal.  ASSESSMENT: Neuralgia and neuritis - Plan: gabapentin  (NEURONTIN) 400 MG capsule, lidocaine (LIDODERM) 5 %, Folate, Vitamin B12, POCT CBC, Sedimentation rate, Ambulatory referral to Neurology  DM (diabetes mellitus) - Plan: COMPLETE METABOLIC PANEL WITH GFR, POCT glycosylated hemoglobin (Hb A1C)  HLD (hyperlipidemia) - Plan: COMPLETE METABOLIC PANEL WITH GFR, NMR Lipoprofile with Lipids  Obesity, unspecified  PLAN: Orders Placed This Encounter  Procedures  . COMPLETE METABOLIC PANEL WITH GFR  . NMR Lipoprofile with Lipids  . Folate  . Vitamin B12  . Sedimentation rate  . Ambulatory referral to  Neurology    Referral Priority:  Routine    Referral Type:  Consultation    Referral Reason:  Specialty Services Required    Requested Specialty:  Neurology    Number of Visits Requested:  1  . POCT glycosylated hemoglobin (Hb A1C)  . POCT CBC   Meds ordered this encounter  Medications  . gabapentin (NEURONTIN) 400 MG capsule    Sig: Take 2 capsules (800 mg total) by mouth 3 (three) times daily.    Dispense:  180 capsule    Refill:  3  . lidocaine (LIDODERM) 5 %    Sig: Place 2 patches onto the skin daily. Remove & Discard patch within 12 hours or as directed by MD    Dispense:  60 patch    Refill:  1   Await labs and neurology evaluation. 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  Return if symptoms worsen or fail to improve, for after evaluation by neurologist..  Thelma Barge P. Modesto Charon, M.D.

## 2012-08-15 LAB — NMR LIPOPROFILE WITH LIPIDS
Cholesterol, Total: 186 mg/dL (ref <200)
HDL Particle Number: 30.3 umol/L — ABNORMAL LOW (ref >=30.5)
HDL Size: 8.7 nm — ABNORMAL LOW (ref >=9.2)
HDL-C: 41 mg/dL (ref >=40)
LDL (calc): 117 mg/dL — ABNORMAL HIGH (ref <100)
LDL Particle Number: 1676 nmol/L — ABNORMAL HIGH (ref <1000)
LDL Size: 20.5 nm — ABNORMAL LOW (ref >20.5)
LP-IR Score: 66 — ABNORMAL HIGH (ref <=45)
Large HDL-P: 4.1 umol/L — ABNORMAL LOW (ref >=4.8)
Large VLDL-P: 3.1 nmol/L — ABNORMAL HIGH (ref <=2.7)
Small LDL Particle Number: 916 nmol/L — ABNORMAL HIGH (ref <=527)
Triglycerides: 139 mg/dL (ref <150)
VLDL Size: 52.5 nm — ABNORMAL HIGH (ref <=46.6)

## 2012-08-15 LAB — COMPLETE METABOLIC PANEL WITH GFR
ALT: 15 U/L (ref 0–53)
AST: 15 U/L (ref 0–37)
Albumin: 4.1 g/dL (ref 3.5–5.2)
Alkaline Phosphatase: 67 U/L (ref 39–117)
BUN: 17 mg/dL (ref 6–23)
CO2: 31 mEq/L (ref 19–32)
Calcium: 9.7 mg/dL (ref 8.4–10.5)
Chloride: 99 mEq/L (ref 96–112)
Creat: 0.99 mg/dL (ref 0.50–1.35)
GFR, Est African American: >89 mL/min
GFR, Est Non African American: 84 mL/min
Glucose, Bld: 130 mg/dL — ABNORMAL HIGH (ref 70–99)
Potassium: 4.8 mEq/L (ref 3.5–5.3)
Sodium: 140 mEq/L (ref 135–145)
Total Bilirubin: 0.6 mg/dL (ref 0.3–1.2)
Total Protein: 6.2 g/dL (ref 6.0–8.3)

## 2012-08-15 LAB — FOLATE: Folate: 7.9 ng/mL

## 2012-08-15 LAB — SEDIMENTATION RATE: Sed Rate: 5 mm/hr (ref 0–16)

## 2012-08-15 LAB — VITAMIN B12: Vitamin B-12: 360 pg/mL (ref 211–911)

## 2012-08-21 ENCOUNTER — Other Ambulatory Visit: Payer: Self-pay | Admitting: Family Medicine

## 2012-08-21 ENCOUNTER — Telehealth: Payer: Self-pay | Admitting: Family Medicine

## 2012-08-21 DIAGNOSIS — M792 Neuralgia and neuritis, unspecified: Secondary | ICD-10-CM

## 2012-08-21 MED ORDER — TRAMADOL HCL 50 MG PO TABS: 100.0000 mg | ORAL_TABLET | Freq: Four times a day (QID) | ORAL | Status: DC | PRN

## 2012-08-21 NOTE — Telephone Encounter (Signed)
Rx printed to call in.

## 2012-08-21 NOTE — Telephone Encounter (Signed)
rx called to [facility]

## 2012-08-24 ENCOUNTER — Telehealth (INDEPENDENT_AMBULATORY_CARE_PROVIDER_SITE_OTHER): Payer: Self-pay

## 2012-08-24 ENCOUNTER — Encounter (INDEPENDENT_AMBULATORY_CARE_PROVIDER_SITE_OTHER): Payer: BC Managed Care – PPO

## 2012-08-24 NOTE — Telephone Encounter (Signed)
I left a message on the patients voicemail letting him know that he missed today's appointment. I told him to call back and reschedule

## 2012-08-28 ENCOUNTER — Telehealth: Payer: Self-pay | Admitting: Family Medicine

## 2012-08-30 ENCOUNTER — Other Ambulatory Visit: Payer: Self-pay | Admitting: Family Medicine

## 2012-08-30 NOTE — Telephone Encounter (Signed)
LM  For pt that this was called in on 08/19/12

## 2012-08-31 ENCOUNTER — Other Ambulatory Visit: Payer: Self-pay | Admitting: Family Medicine

## 2012-08-31 DIAGNOSIS — M792 Neuralgia and neuritis, unspecified: Secondary | ICD-10-CM

## 2012-08-31 MED ORDER — CELECOXIB 200 MG PO CAPS: 200.0000 mg | ORAL_CAPSULE | Freq: Two times a day (BID) | ORAL | Status: DC

## 2012-08-31 MED ORDER — OMEPRAZOLE 40 MG PO CPDR: 40.0000 mg | DELAYED_RELEASE_CAPSULE | Freq: Every day | ORAL | Status: DC

## 2012-08-31 NOTE — Telephone Encounter (Signed)
Tramadol was Rx for short term. He will need to see the neurologist to determine the cause for the nerve type pain. The gabapentin is for long term neuropathy relief. I want him to stop any Ibuprofen. Celebrex 200 mg daily. Omeprazole 40 mg daily. Ordered in EPIC.

## 2012-08-31 NOTE — Telephone Encounter (Signed)
Spoke with pt who was requesting tramadol -upset that he is having to call for tramadol  The message below was relayed to pt and new rx sent to [facility].

## 2012-09-06 ENCOUNTER — Encounter (HOSPITAL_COMMUNITY): Payer: Self-pay | Admitting: Emergency Medicine

## 2012-09-06 ENCOUNTER — Encounter (INDEPENDENT_AMBULATORY_CARE_PROVIDER_SITE_OTHER): Payer: Self-pay | Admitting: Physician Assistant

## 2012-09-06 ENCOUNTER — Emergency Department (HOSPITAL_COMMUNITY)
Admission: EM | Admit: 2012-09-06 | Discharge: 2012-09-06 | Disposition: A | Discharge status: Home / Self Care | Payer: BC Managed Care – PPO | Attending: Emergency Medicine | Admitting: Emergency Medicine

## 2012-09-06 DIAGNOSIS — G4730 Sleep apnea, unspecified: Secondary | ICD-10-CM | POA: Insufficient documentation

## 2012-09-06 DIAGNOSIS — Z794 Long term (current) use of insulin: Secondary | ICD-10-CM | POA: Insufficient documentation

## 2012-09-06 DIAGNOSIS — Z8639 Personal history of other endocrine, nutritional and metabolic disease: Secondary | ICD-10-CM | POA: Insufficient documentation

## 2012-09-06 DIAGNOSIS — Z8619 Personal history of other infectious and parasitic diseases: Secondary | ICD-10-CM | POA: Insufficient documentation

## 2012-09-06 DIAGNOSIS — Z862 Personal history of diseases of the blood and blood-forming organs and certain disorders involving the immune mechanism: Secondary | ICD-10-CM | POA: Insufficient documentation

## 2012-09-06 DIAGNOSIS — Z85820 Personal history of malignant melanoma of skin: Secondary | ICD-10-CM | POA: Insufficient documentation

## 2012-09-06 DIAGNOSIS — M129 Arthropathy, unspecified: Secondary | ICD-10-CM | POA: Insufficient documentation

## 2012-09-06 DIAGNOSIS — I10 Essential (primary) hypertension: Secondary | ICD-10-CM | POA: Insufficient documentation

## 2012-09-06 DIAGNOSIS — Z79899 Other long term (current) drug therapy: Secondary | ICD-10-CM | POA: Insufficient documentation

## 2012-09-06 DIAGNOSIS — B029 Zoster without complications: Secondary | ICD-10-CM | POA: Insufficient documentation

## 2012-09-06 DIAGNOSIS — F3289 Other specified depressive episodes: Secondary | ICD-10-CM | POA: Insufficient documentation

## 2012-09-06 DIAGNOSIS — E669 Obesity, unspecified: Secondary | ICD-10-CM | POA: Insufficient documentation

## 2012-09-06 DIAGNOSIS — E119 Type 2 diabetes mellitus without complications: Secondary | ICD-10-CM | POA: Insufficient documentation

## 2012-09-06 DIAGNOSIS — Z8719 Personal history of other diseases of the digestive system: Secondary | ICD-10-CM | POA: Insufficient documentation

## 2012-09-06 DIAGNOSIS — Z87891 Personal history of nicotine dependence: Secondary | ICD-10-CM | POA: Insufficient documentation

## 2012-09-06 DIAGNOSIS — J45909 Unspecified asthma, uncomplicated: Secondary | ICD-10-CM | POA: Insufficient documentation

## 2012-09-06 DIAGNOSIS — F329 Major depressive disorder, single episode, unspecified: Secondary | ICD-10-CM | POA: Insufficient documentation

## 2012-09-06 DIAGNOSIS — Z8679 Personal history of other diseases of the circulatory system: Secondary | ICD-10-CM | POA: Insufficient documentation

## 2012-09-06 MED ORDER — TRAMADOL HCL 50 MG PO TABS: 50.0000 mg | ORAL_TABLET | Freq: Once | ORAL | Status: DC

## 2012-09-06 MED ORDER — TRAMADOL HCL 50 MG PO TABS
100.0000 mg | ORAL_TABLET | Freq: Once | ORAL | Status: AC
  Administered 2012-09-06: 100 mg via ORAL
  Filled 2012-09-06: qty 2

## 2012-09-06 MED ORDER — TRAMADOL HCL 50 MG PO TABS: 100.0000 mg | ORAL_TABLET | Freq: Once | ORAL | Status: DC

## 2012-09-06 NOTE — ED Provider Notes (Signed)
CSN: [id number]     Arrival date & time 09/06/12  0605 History     First MD Initiated Contact with Patient 09/06/12 0615     Chief Complaint  Patient presents with  . Herpes Zoster   (Consider location/radiation/quality/duration/timing/severity/associated sxs/prior Treatment) HPI Comments: Patient is a 57 year old male past medical history significant for DM, HTN, HLD, Hepatitis C, Arthritis, Asthma, Depression, GERD presenting to the ED for flare up of his Herpes Zoster. Patient states he was diagnosed with "internal" Herpes Zoster in June of this year and has been seeing his PCP until two weeks ago when he "fired him" because he stopped giving him his Tramadol for pain. Patient states he has been taking his Neurotin and Celexa for his pain without relief and states "I can't wait until Monday when I see [facility] with this pain." Patient rates his pain severe unremitting burning on his entire left. Patient states he really just needs a medication refill to help with his pain. Denies CP, SOB, nausea, vomiting, diarrhea, abdominal pain.    Past Medical History  Diagnosis Date  . Arthritis   . Asthma   . Diabetes mellitus without complication   . Hyperlipidemia   . Hypertension   . Hepatitis C     GENOTYPE II-  . Cancer     melanoma  . Hiatal hernia   . GERD (gastroesophageal reflux disease)   . Depression   . Melanoma     ARM AND CHEST  . Hemorrhoids   . Perirectal abscess     I & D 03/2007  . Sleep apnea   . Obesity    Past Surgical History  Procedure Laterality Date  . Laparoscopic gastric banding  2010  . Rotator cuff repair  2010  . Rotator cuff repair  1989    left  . Hiatal hernia repair  1998  . Tonsillectomy    . Anal fissure repair     Family History  Problem Relation Age of Onset  . Brain cancer Mother   . Colon cancer Father 71  . Heart attack Paternal Grandfather   . Diabetes Father   . Leukemia Paternal Grandfather   . CVA Father   . CAD    .  Prostate cancer Father   . Testicular cancer Father   . Diabetes Brother   . Diabetes Paternal Grandmother   . Stroke Paternal Grandfather    History  Substance Use Topics  . Smoking status: Former Smoker    Quit date: 01/25/1982  . Smokeless tobacco: Not on file  . Alcohol Use: No    Review of Systems  Constitutional: Negative.   HENT: Negative.   Eyes: Negative.   Respiratory: Negative for shortness of breath.   Cardiovascular: Negative for chest pain.  Gastrointestinal: Negative for nausea and vomiting.  Genitourinary: Negative.   Musculoskeletal: Positive for back pain.  Skin: Negative for rash.  Neurological: Negative for headaches.    Allergies  Bee venom and Tape  Home Medications   Current Outpatient Rx  Name  Route  Sig  Dispense  Refill  . albuterol (PROVENTIL HFA;VENTOLIN HFA) 108 (90 BASE) MCG/ACT inhaler   Inhalation   Inhale 2 puffs into the lungs every 6 (six) hours as needed for wheezing or shortness of breath.         . celecoxib (CELEBREX) 200 MG capsule   Oral   Take 1 capsule (200 mg total) by mouth 2 (two) times daily.   30 capsule  0   . Fluticasone-Salmeterol (ADVAIR) 250-50 MCG/DOSE AEPB   Inhalation   Inhale 1 puff into the lungs 2 (two) times daily as needed (for shortness of breath).          . gabapentin (NEURONTIN) 400 MG capsule   Oral   Take 2 capsules (800 mg total) by mouth 3 (three) times daily.   180 capsule   3   . insulin aspart (NOVOLOG) 100 UNIT/ML injection   Subcutaneous   Inject 10-12 Units into the skin 3 (three) times daily. SLIDING SCALE         . insulin glargine (LANTUS) 100 UNIT/ML injection   Subcutaneous   Inject 60 Units into the skin at bedtime.         . lidocaine (LIDODERM) 5 %   Transdermal   Place 2 patches onto the skin daily. Remove & Discard patch within 12 hours or as directed by MD   60 patch   1   . traMADol (ULTRAM) 50 MG tablet   Oral   Take 2 tablets (100 mg total) by mouth  every 6 (six) hours as needed for pain.   40 tablet   0   . traMADol (ULTRAM) 50 MG tablet   Oral   Take 2 tablets (100 mg total) by mouth once.   20 tablet   0    BP 162/83  Pulse 56  Temp(Src) 98.2 F (36.8 C) (Oral)  Resp 18  SpO2 97% Physical Exam  Constitutional: He is oriented to person, place, and time. He appears well-developed and well-nourished. No distress.  HENT:  Head: Normocephalic and atraumatic.  Mouth/Throat: Oropharynx is clear and moist.  Eyes: Conjunctivae are normal.  Neck: Normal range of motion. Neck supple.  Cardiovascular: Normal rate, regular rhythm, normal heart sounds and intact distal pulses.   Pulmonary/Chest: Effort normal and breath sounds normal.  Abdominal: Soft. Bowel sounds are normal. There is no tenderness.  Musculoskeletal: Normal range of motion. He exhibits no edema.  Neurological: He is alert and oriented to person, place, and time.  Skin: Skin is warm, dry and intact. He is not diaphoretic.     Sites of pain marked. Pt w/ two Lidoderm patches in those areas as well. No rash noted.     ED Course   Procedures (including critical care time)  Labs Reviewed - No data to display No results found. 1. Herpes zoster     MDM  Patient presented to the ED requesting pain medication for his internal shingles as his pain has increased greatly and he states he is unable to deal with the pain until his neurology followup on Monday. Physical exam is unremarkable. No concern for other etiology causing his 2 month long left-sided pain. Patient is willing to be started on acyclovir as he stated he felt him in the past. Patient is already on Neurontin. Patient will be given a limited supply of pain medication and told he must followup with neurology on Monday or he must return to his PCP or obtaining a PCP for his pain management. Patient is agreeable to plan. Patient d/w with Dr. Nicanor Alcon, agrees with plan. Patient is stable at time of  discharge    Jeannetta Ellis, PA-C 09/06/12 1610

## 2012-09-06 NOTE — ED Notes (Signed)
PA at bedside.

## 2012-09-06 NOTE — ED Notes (Signed)
Pt states he has severe pain in his right upper and lower rib cage, lower back, and in his right arm. Pt states his skin is hot to the touch where he has pain. Pt relates the pain to the neuropathy that he feels in the foot. Pt states he first recognized his shingles pain on June 6 when he traveled to [place]. Pt states he return from [place] after two weeks and saw him primary care doctor and was diagnosed on June 23 with shingles. Pt states his shingles is internal. Pt states his pain has increased greatly and that he is unable to sleep. Pt is A&O X4. Pt denies SOB or any difficulties breathing.

## 2012-09-11 ENCOUNTER — Ambulatory Visit: Payer: Medicare PPO

## 2012-09-14 NOTE — ED Provider Notes (Signed)
Medical screening examination/treatment/procedure(s) were performed by non-physician practitioner and as supervising physician I was immediately available for consultation/collaboration.  Kimberley Dastrup K Early Steel-Rasch, MD 09/14/12 2334 

## 2012-09-16 ENCOUNTER — Encounter: Payer: Self-pay | Admitting: Neurology

## 2012-09-18 ENCOUNTER — Ambulatory Visit (INDEPENDENT_AMBULATORY_CARE_PROVIDER_SITE_OTHER): Payer: BC Managed Care – PPO | Admitting: Neurology

## 2012-09-18 ENCOUNTER — Encounter: Payer: Self-pay | Admitting: Neurology

## 2012-09-18 VITALS — BP 147/69 | HR 66 | Ht 74.0 in | Wt 286.0 lb

## 2012-09-18 DIAGNOSIS — B0229 Other postherpetic nervous system involvement: Secondary | ICD-10-CM

## 2012-09-18 DIAGNOSIS — R200 Anesthesia of skin: Secondary | ICD-10-CM

## 2012-09-18 DIAGNOSIS — IMO0002 Reserved for concepts with insufficient information to code with codable children: Secondary | ICD-10-CM

## 2012-09-18 DIAGNOSIS — M792 Neuralgia and neuritis, unspecified: Secondary | ICD-10-CM

## 2012-09-18 DIAGNOSIS — R209 Unspecified disturbances of skin sensation: Secondary | ICD-10-CM

## 2012-09-18 DIAGNOSIS — R52 Pain, unspecified: Secondary | ICD-10-CM

## 2012-09-18 MED ORDER — TRAMADOL HCL 50 MG PO TABS: 100.0000 mg | ORAL_TABLET | Freq: Four times a day (QID) | ORAL | Status: DC | PRN

## 2012-09-18 MED ORDER — GABAPENTIN ENACARBIL ER 600 MG PO TBCR: 600.0000 mg | EXTENDED_RELEASE_TABLET | Freq: Every day | ORAL | Status: DC

## 2012-09-18 NOTE — Progress Notes (Signed)
[facility]  Provider:  Dr Hosie Poisson Referring Provider: Ileana Ladd, MD Primary Care Physician:  Redmond Baseman, MD  CC:  Pain on right side  HPI:  [name] is a 57 y.o. male here as a referral from Dr. Modesto Charon for evaluation of pain on right side of body  [name] is a pleasant 55 old gentleman with about 2-3 months of chronic continuous pain  located on the right upper side of his body. He notes a burning pain in his armpit region on the right, pain in his right chest and the diaphragmatic area, numbness from armpit 200 L. on the right, and pain in the right thoracic region of his back. The pain is described under the arm is burning continuous pain. His numbness from the arm to elbow, and stabbing pain in his thoracic and diaphragmatic region that mimics a broken rib per the patient, she denies any alleviating factors for this pain. He notes it is worse if he rolls over on his right side. It fluctuates slightly throughout the day but is typically continuous. Right out as a 7/10 he can go to 9/10. Denies any episodes of rash is prior to the pain event. Notes the pain began after a long flight. Notes some pain related weakness in her upper extremities otherwise no pain. No symptoms in the left upper extremity or bilateral lower extremities.   Does have a history of hep C and diabetes type 2, which he reports is well controlled. No history of neck trauma, no focal weakness, no facial weakness or sensory symptoms, no facial droop. No history of transient vision loss blurry vision. Daily symptomatically for his pain has been tramadol. Reports trying Neurontin though low dose and did not notice any benefit.   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   Review of Systems: Out of a complete 14 system review, the patient complains of only the following symptoms, and all other reviewed systems are negative. Positive for fatigue feeling hot joint pain aching muscles  none of sleep numbness weakness sleepiness  History   Social History  . Marital Status: Married    Spouse Name: Deryl    Number of Children: 2  . Years of Education: college   Occupational History  . 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      Retired   Social History Main Topics  . Smoking status: Former Smoker -- 1.00 packs/day    Types: Cigarettes    Quit date: 01/25/1982  . Smokeless tobacco: Never Used  . Alcohol Use: No  . Drug Use: No  . Sexual Activity: Not on file   Other Topics Concern  . Not on file   Social History Narrative   Patient lives at home with his wife Herbalist ) Patient is retired from [employer]. Patient has a college education.   Caffeine- two cups daily.   Right handed.    Family History  Problem Relation Age of Onset  . Brain cancer Mother   . Colon cancer Father 54  . Diabetes Father   . CVA Father   . Prostate cancer Father   . Testicular cancer Father   . Heart attack Paternal Grandfather   . Leukemia Paternal Grandfather   . Stroke Paternal Grandfather   . CAD    . Diabetes Brother   . Diabetes Paternal Grandmother     Past Medical History  Diagnosis Date  .  Arthritis   . Asthma   . Diabetes mellitus without complication   . Hyperlipidemia   . Hypertension   . Hepatitis C     GENOTYPE II-  . Cancer     melanoma  . Hiatal hernia   . GERD (gastroesophageal reflux disease)   . Depression   . Melanoma     ARM AND CHEST  . Hemorrhoids   . Perirectal abscess     I & D 03/2007  . Sleep apnea   . Obesity     Past Surgical History  Procedure Laterality Date  . Laparoscopic gastric banding  2010  . Rotator cuff repair  2010  . Rotator cuff repair  1989    left  . Hiatal hernia repair  1998  . Tonsillectomy    . Anal fissure repair      Current Outpatient Prescriptions  Medication Sig Dispense Refill  . albuterol (PROVENTIL HFA;VENTOLIN HFA) 108 (90 BASE) MCG/ACT inhaler Inhale 2 puffs  into the lungs every 6 (six) hours as needed for wheezing or shortness of breath.      . celecoxib (CELEBREX) 200 MG capsule Take 1 capsule (200 mg total) by mouth 2 (two) times daily.  30 capsule  0  . Fluticasone-Salmeterol (ADVAIR) 250-50 MCG/DOSE AEPB Inhale 1 puff into the lungs 2 (two) times daily as needed (for shortness of breath).       . gabapentin (NEURONTIN) 400 MG capsule Take 2 capsules (800 mg total) by mouth 3 (three) times daily.  180 capsule  3  . insulin aspart (NOVOLOG) 100 UNIT/ML injection Inject 10-12 Units into the skin 3 (three) times daily. SLIDING SCALE      . insulin glargine (LANTUS) 100 UNIT/ML injection Inject 60 Units into the skin at bedtime.      . lidocaine (LIDODERM) 5 % Place 2 patches onto the skin daily. Remove & Discard patch within 12 hours or as directed by MD  60 patch  1  . traMADol (ULTRAM) 50 MG tablet Take 2 tablets (100 mg total) by mouth every 6 (six) hours as needed for pain.  40 tablet  0  . traMADol (ULTRAM) 50 MG tablet Take 2 tablets (100 mg total) by mouth once.  20 tablet  0   No current facility-administered medications for this visit.    Allergies as of 09/18/2012 - Review Complete 09/18/2012  Allergen Reaction Noted  . Bee venom Shortness Of Breath and Anxiety 01/11/2012  . Tape Rash 01/11/2012    Vitals: BP 147/69  Pulse 66  Ht 6\' 2"  (1.88 m)  Wt 286 lb (129.729 kg)  BMI 36.7 kg/m2 Last Weight:  Wt Readings from Last 1 Encounters:  09/18/12 286 lb (129.729 kg)   Last Height:   Ht Readings from Last 1 Encounters:  09/18/12 6\' 2"  (1.88 m)     Physical exam: Exam: Gen: NAD, conversant Eyes: anicteric sclerae, moist conjunctivae HENT: Atraumati Neck: Trachea midline; supple,  Lungs: CTA, no wheezing, rales, rhonic                          CV: RRR, no MRG Abdomen: Soft, non-tender;  Extremities: No peripheral edema  Skin: Normal temperature, no rash, few erythematous lesions of right trunk that could potentially  represent ruptured vesicles but not conclusive Psych: Appropriate affect, pleasant  Neuro: MS: AA&Ox3, appropriately interactive, normal affect   Speech: fluent w/o paraphasic error  Memory: good recent and remote recall  CN:  PERRL, EOMI no nystagmus, no ptosis, sensation intact to LT V1-V3 bilat, face symmetric, no weakness, hearing grossly intact, palate elevates symmetrically, shoulder shrug 5/5 bilat,  tongue protrudes midline, no fasiculations noted.  Motor: normal bulk and tone Strength: 5/5  In all extremities  Coord: rapid alternating and point-to-point (FNF, HTS) movements intact.  Reflexes: symmetrical, bilat downgoing toes  Sens: LT intact in all extremities, intact PP, temp, vibration bilat UE  Gait: posture, stance, stride and arm-swing normal. Tandem gait intact. Able to walk on heels and toes. Romberg absent.   Assessment:  After physical and neurologic examination, review of laboratory studies, imaging, neurophysiology testing and pre-existing records, assessment will be reviewed on the problem list.  Plan:  Treatment plan and additional workup will be reviewed under Problem List.  [name] is a pleasant 57 year old gentleman who presents for initial evaluation of right upper extremity severe pain and numbness. This started acutely in June has been continuous since then, fluctuates throughout the day. He has been tried on tramadol and gabapentin which, given him some symptomatic relief. His physical exam is unremarkable, there is a few small questionable erythematous lesions on his right trunk are potentially could represent ruptured vesicles, though this is unclear. Motor and sensory-wise exam is unremarkable. Based on that this reduction of some of the pain there is a question if it is dermatomal in distribution which point towards postherpetic neuralgia as being a possibility, the patient does not report history of vesicles. Based on unilateral symptoms would also  raise concern for a central process.  Neuralgia and neuritis - Plan: traMADol (ULTRAM) 50 MG tablet  Hemisensory loss - Plan: Miguel Brain Ltd W/O Cm, Miguel Cervical Spine Wo Contrast  Pain - Plan: Miguel Brain Ltd W/O Cm, Miguel Cervical Spine Wo Contrast  Post herpetic neuralgia  -Check MRI brain and C-spine -Will start patient on her right and 600 mg twice a day for suspected postherpetic neuralgia -Will continue Tylenol when necessary for symptomatic relief -Followup in 3 months or as needed

## 2012-09-18 NOTE — Patient Instructions (Addendum)
-  MRI of brain and Cervical spine to be scheduled with [facility] in [place] using their open MRI: Please call Phone: [phone]  -we are starting you on a medication called Horizant 600mg . Please take one tablet, 600mg  daily for 3 days and then increase to twice a day  -discontinue the gabapentin  -follow up in 2 to 3 months or as needed

## 2012-09-19 ENCOUNTER — Other Ambulatory Visit: Payer: Self-pay

## 2012-09-19 MED ORDER — GABAPENTIN ENACARBIL ER 600 MG PO TBCR: 600.0000 mg | EXTENDED_RELEASE_TABLET | ORAL | Status: DC

## 2012-09-19 NOTE — Telephone Encounter (Signed)
Pharmacy called and asked [place] to resend the Rx for Gabapentin.

## 2012-09-20 ENCOUNTER — Institutional Professional Consult (permissible substitution): Payer: BC Managed Care – PPO | Admitting: Cardiology

## 2012-10-20 ENCOUNTER — Other Ambulatory Visit: Payer: Self-pay

## 2012-10-20 DIAGNOSIS — M792 Neuralgia and neuritis, unspecified: Secondary | ICD-10-CM

## 2012-10-20 MED ORDER — TRAMADOL HCL 50 MG PO TABS: 100.0000 mg | ORAL_TABLET | Freq: Four times a day (QID) | ORAL | Status: DC | PRN

## 2012-10-20 NOTE — Telephone Encounter (Signed)
Rx signed and faxed.

## 2012-10-20 NOTE — Telephone Encounter (Signed)
Patient called requesting a refill on Tramadol

## 2012-11-02 ENCOUNTER — Encounter: Payer: Self-pay | Admitting: Gastroenterology

## 2012-11-20 ENCOUNTER — Ambulatory Visit: Payer: BC Managed Care – PPO | Admitting: Neurology

## 2012-11-22 ENCOUNTER — Encounter (INDEPENDENT_AMBULATORY_CARE_PROVIDER_SITE_OTHER): Payer: Self-pay

## 2012-11-22 ENCOUNTER — Encounter: Payer: BC Managed Care – PPO | Admitting: Neurology

## 2012-11-22 ENCOUNTER — Other Ambulatory Visit: Payer: Self-pay | Admitting: Neurology

## 2012-11-22 DIAGNOSIS — M792 Neuralgia and neuritis, unspecified: Secondary | ICD-10-CM

## 2012-11-22 MED ORDER — GABAPENTIN ENACARBIL ER 600 MG PO TBCR: 600.0000 mg | EXTENDED_RELEASE_TABLET | ORAL | Status: AC

## 2012-11-22 MED ORDER — CELECOXIB 200 MG PO CAPS: 200.0000 mg | ORAL_CAPSULE | Freq: Two times a day (BID) | ORAL | Status: AC

## 2012-11-22 MED ORDER — TRAMADOL HCL 50 MG PO TABS: 100.0000 mg | ORAL_TABLET | Freq: Four times a day (QID) | ORAL | Status: DC | PRN

## 2012-11-22 NOTE — Progress Notes (Addendum)
  This encounter was created in error - please disregard. Medications sent

## 2012-12-29 ENCOUNTER — Other Ambulatory Visit: Payer: Self-pay | Admitting: Neurology

## 2012-12-29 DIAGNOSIS — M792 Neuralgia and neuritis, unspecified: Secondary | ICD-10-CM

## 2012-12-29 MED ORDER — TRAMADOL HCL 50 MG PO TABS: 100.0000 mg | ORAL_TABLET | Freq: Four times a day (QID) | ORAL | Status: DC | PRN

## 2012-12-29 NOTE — Telephone Encounter (Signed)
Patient requesting a refill of Tramadol. Patient changed his pharmacy to [facility] in [place], [place]. Patient states that he will run out of his medication during the weekend.

## 2013-02-02 ENCOUNTER — Other Ambulatory Visit: Payer: Self-pay | Admitting: Neurology

## 2013-02-06 ENCOUNTER — Other Ambulatory Visit: Payer: Self-pay

## 2013-02-06 DIAGNOSIS — M792 Neuralgia and neuritis, unspecified: Secondary | ICD-10-CM

## 2013-02-06 MED ORDER — TRAMADOL HCL 50 MG PO TABS: 100.0000 mg | ORAL_TABLET | Freq: Four times a day (QID) | ORAL | Status: DC | PRN

## 2013-03-15 ENCOUNTER — Other Ambulatory Visit: Payer: Self-pay | Admitting: Neurology

## 2013-03-15 ENCOUNTER — Telehealth: Payer: Self-pay | Admitting: Neurology

## 2013-03-15 DIAGNOSIS — M792 Neuralgia and neuritis, unspecified: Secondary | ICD-10-CM

## 2013-03-15 MED ORDER — TRAMADOL HCL 50 MG PO TABS: 100.0000 mg | ORAL_TABLET | Freq: Four times a day (QID) | ORAL | Status: DC | PRN

## 2013-03-15 NOTE — Telephone Encounter (Signed)
Raquel Sarna from [facility] calling for a refill tramadol for patient. Please call [phone].

## 2013-03-15 NOTE — Telephone Encounter (Signed)
Patient calling about tramadol refill, states he has been waiting for awhile and would like a call back. Please call patient.

## 2013-03-15 NOTE — Telephone Encounter (Signed)
This is a controlled substance Rx.  The request was just sent today at 12:14.  We will fax authorization as soon as the Rx has been signed.  I called the pharmacy back.  Spoke with [employer].  She will relay message to [facility].

## 2013-03-15 NOTE — Telephone Encounter (Signed)
Rx signed and faxed.

## 2013-04-19 ENCOUNTER — Other Ambulatory Visit: Payer: Self-pay | Admitting: Neurology

## 2013-04-19 NOTE — Telephone Encounter (Signed)
Rx signed and faxed.

## 2013-05-10 ENCOUNTER — Encounter (INDEPENDENT_AMBULATORY_CARE_PROVIDER_SITE_OTHER): Payer: BC Managed Care – PPO

## 2013-05-10 ENCOUNTER — Telehealth (INDEPENDENT_AMBULATORY_CARE_PROVIDER_SITE_OTHER): Payer: Self-pay | Admitting: General Surgery

## 2013-05-10 NOTE — Telephone Encounter (Signed)
LMOM for patient to call back and ask for [name] 

## 2013-05-11 ENCOUNTER — Encounter (INDEPENDENT_AMBULATORY_CARE_PROVIDER_SITE_OTHER): Payer: Self-pay | Admitting: Physician Assistant

## 2013-08-23 ENCOUNTER — Telehealth: Payer: Self-pay | Admitting: *Deleted

## 2013-08-23 NOTE — Telephone Encounter (Signed)
Received call from this patient requesting a copy of his Sleep Study that was done a while back mailed to his home address in [facility]. Pt was pt here at [facility] then transferred to [facility], Could not find chart here, I called Freda Munro from [facility] and she states she will look him up and take care of it.

## 2016-11-22 ENCOUNTER — Encounter (HOSPITAL_COMMUNITY): Payer: Self-pay

## 2017-11-21 ENCOUNTER — Encounter (HOSPITAL_COMMUNITY): Payer: Self-pay
